# Patient Record
Sex: Male | Born: 1955 | Race: White | Hispanic: No | Marital: Single
Health system: Midwestern US, Academic
[De-identification: ages and names within clinical notes are randomized; demographics above are authoritative.]

---

## 2017-07-10 ENCOUNTER — Encounter: Admit: 2017-07-10 | Discharge: 2017-07-10 | Payer: MEDICAID | Primary: Family

## 2017-07-10 NOTE — Telephone Encounter
Received new referral, via fax. Docs scanned in 07/10/17.

## 2017-07-11 ENCOUNTER — Encounter: Admit: 2017-07-11 | Discharge: 2017-07-11 | Payer: MEDICAID | Primary: Family

## 2017-07-11 NOTE — Telephone Encounter
Service (OLT/Gen Hep/HPB): OLT  Referring: Dr. 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

## 2017-07-11 NOTE — Telephone Encounter
BENEFIT COLLECTION:  SPOKE TO: N/A  Verified by:  Lucilla Lame                                      Date:  July 11, 2017  Ins Plan:  [place] MEDICAID       EFF: 07/11/2017             Plan Type:  SUNFLOWER  ID #:[id number]                GR#: N/A   Subscriber:SELF  CS PHONE# [phone]    Medical Benefits:  Silvio Pate (prev [place] Medicaid):  $48 INPT ADMIT COPAY  NURSING HOME/LONG TERM CARE [place] $3/VISIT  SURGICENTER $3/DOS  OUTPT REHAB $1/VISIT  DME $3/CLAIM  HHC $3/VISIT  NON-EMERGENCY TRANSPORT OR AMBULANCE $3/DOS  OV $2/COPAY  [place] [phone], $3/VISIT  DENTAL $3/DOS  BDCT: NOT REQUIRED    Donor Benefits:  [place] Donor Benefits:NONE ON PLAN  Travel and Lodging for pt:PT ONLY  Donor Travel and Lodging for [place] ON PLAN    RX Plan:SUNFLOWER   Phone #:  [phone]  RX $3/FILL (NO MAIL ORDER)      TXP Network: SUNFLOWER   HYI:FOYDX     Phone #: [phone]   FAX #:  [phone]  Auth requirements: VERBAL EVAL AND LISTING AUTH REQUIRED   CALL REF# N/A

## 2017-07-12 ENCOUNTER — Encounter: Admit: 2017-07-12 | Discharge: 2017-07-12 | Payer: MEDICAID | Primary: Family

## 2017-07-12 NOTE — Telephone Encounter
Called patient to schedule new appointment with Dr. Pamala Hurry on Monday, August 20, 2017 at 8:40am.   Verified demos and mailed appointment letter/map.   Pt stated they have current insurance.

## 2017-08-20 ENCOUNTER — Ambulatory Visit: Admit: 2017-08-20 | Discharge: 2017-08-20 | Payer: MEDICAID | Primary: Family

## 2017-08-20 ENCOUNTER — Encounter: Admit: 2017-08-20 | Discharge: 2017-08-20 | Payer: MEDICAID | Primary: Family

## 2017-08-20 DIAGNOSIS — K7469 Other cirrhosis of liver: Principal | ICD-10-CM

## 2017-08-20 DIAGNOSIS — I8510 Secondary esophageal varices without bleeding: ICD-10-CM

## 2017-08-20 DIAGNOSIS — B182 Chronic viral hepatitis C: ICD-10-CM

## 2017-08-20 LAB — COMPREHENSIVE METABOLIC PANEL
Lab: 1.5 mg/dL — ABNORMAL HIGH (ref 0.3–1.2)
Lab: 109 mg/dL — ABNORMAL HIGH (ref 70–100)
Lab: 137 MMOL/L — ABNORMAL LOW (ref 137–147)
Lab: 27 MMOL/L (ref 21–30)
Lab: 3.5 g/dL (ref 3.5–5.0)
Lab: 32 U/L — ABNORMAL HIGH (ref 7–40)
Lab: 4.3 MMOL/L — ABNORMAL LOW (ref 3.5–5.1)
Lab: 6 K/UL (ref 3–12)
Lab: 7 g/dL (ref 6.0–8.0)
Lab: >60 mL/min (ref >60)
Lab: >60 mL/min — ABNORMAL HIGH (ref >60)

## 2017-08-20 LAB — PROTIME INR (PT): Lab: 1.2 MMOL/L — ABNORMAL HIGH (ref 0.8–1.2)

## 2017-08-20 LAB — CBC AND DIFF
Lab: 0.1 10*3/uL (ref 0–0.20)
Lab: 3.7 M/UL — ABNORMAL LOW (ref 4.4–5.5)
Lab: 6.6 10*3/uL (ref 4.5–11.0)

## 2017-08-20 LAB — ALCOHOL LEVEL: Lab: <10 mg/dL — ABNORMAL LOW (ref 8.5–10.6)

## 2017-08-20 LAB — HEPATITIS A IGG: Lab: POSITIVE U/L (ref 25–110)

## 2017-08-20 LAB — HEPATITIS B SURFACE AB: Lab: 209 m[IU]/mL (ref 7–56)

## 2017-08-20 MED ORDER — AMLODIPINE 5 MG PO TAB
5 mg | ORAL_TABLET | Freq: Every day | ORAL | 6 refills | Status: AC
Start: 2017-08-20 — End: 2018-02-14

## 2017-08-20 NOTE — Progress Notes
Date of Service: 08/20/2017    Tyler Wolfe is a 61 y.o. male.    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  The patient was diagnosed with ascites secondary to decompensated cirrhosis earlier this year.  He was diagnosed with hepatitis C at the time and was treated with SVR at [facility] per the patient.  We do not have records of his hepatitis C treatment at this time.  The patient underwent a screening EGD on 06/13/2017 at which time he was found to have 2 large esophageal varices which were banded .  His ascites and his leg edema has been stable after initiation of Lasix and Aldactone, however he is having side effects from Aldactone with gynecomastia and pain under the nipple.  He underwent a mammogram for this and it was normal per the patient.  Patient thinks he might have acquired his hepatitis C from his ex-wife/ girlfriend who needed several blood transfusions secondary to underlying sputum cytosis.  The patient had 2 professionally placed tattoos and one was placed overseas [place]  He denies any transfusions, IV drug abuse, needle sharing or any high risk sexual behavior.  He denies any hematochezia, melena, hematemesis, abdominal distention, leg edema at this time.  His colonoscopy was 20 years ago and was normal at the time.    Review of Systems   Constitutional: Negative.    HENT: Negative.    Eyes: Negative.    Respiratory: Negative.    Cardiovascular: Negative.    Gastrointestinal: Negative.    Endocrine: Negative.    Musculoskeletal: Negative.    Skin: Negative.    Allergic/Immunologic: Negative.    Neurological: Negative.    Psychiatric/Behavioral: Negative.       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    Past surgical history:  Right knee arthroscopy Past social history:   Quit alcohol in 2017 August.  Before that he was drinking 6-12 pack beer per week with 2 additional shots per week.    Continues to smoke half pack per week.    Used to do marijuana in the past but quit 9 years ago.    Objective:         ??? amLODIPine (NORVASC) 5 mg tablet Take one tablet by mouth daily.   ??? epinephrine(+) (EPIPEN JR) 0.15 mg/0.3 mL (1:2,000) syringe Inject 0.15 mg to area(s) as directed as Needed.     Vitals:    08/20/17 0849   BP: 97/62   Pulse: 57   Resp: 16   Temp: 36.8 ???C (98.3 ???F)   TempSrc: Oral   SpO2: 94%   Weight: 99.2 kg (218 lb 12.8 oz)   Height: 177.8 cm (70)     Body mass index is 31.39 kg/m???.     Labs and Diagnostic Test:  Outside labs reviewed.  Meld score very low.    Imaging:  CT scan of the abdomen in December 2018 which revealed liver cirrhosis and ascites but no evidence of any focal masses.    Physical Exam    Vitals reviewed.   Constitutional: well-developed, well-nourished, in no apparent distress.  Responds appropriately to questions.   Neuro:  No tremor noted.  Patient is AOx4.  HEENT:  Head is normocephalic and atraumatic.  No scleral icterus noted. EOM normal & PERRLA.  Dentures present.  Neck and Lymph: Normal ROM. Neck supple.  Cardiac:  regular rate and rhythm.  No murmurs.  Respiratory:  Normal respiratory excursion  No wheezes  GI:  Abdomen soft, non-distended, non-tender. BS present.  No appreciable ascites.  No hepatomegaly.  Skin:  Skin is warm, dry, and intact. No rash noted.  Spider nevi seen.  Peripheral Vascular: 1+ edema lower extremity.  Musculoskeletal:  ROM intact.    Psychiatric: Normal mood and affect. Behavior is normal. Judgment and thought content normal.    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  But based on his normal creatinine, close to normal T bili and normal sodium his meld score is likely lower.  -We will order ultrasound of his liver during the next visit.  -We will obtain meld labs today and every 6 months from there.  -We will also order hepatitis C PCR to ensure SVR at this time. (Treated earlier this year with SVR per patient at [facility])  -We will also check for hepatitis A and hepatitis B immunity .  ???  Ascites and lower extremity edema:  -  CT in October 2018 showed ascites .  -  Much improved ascites and lower extremity edema  after initiation of Lasix and Aldactone.  However he is having gynecomastia from Aldactone.  Mammogram  done recently and it is normal per the patient . We will discontinue Aldactone at this time.  Will add amiloride 5 mg was  Daily.  Check BMP in 1 weeks.  ???Continue Lasix.    Esophageal varices:  -EGD 06/13/2017 with 2 columns of large varices banded.  -He will need monthly EGDs until his varices are eradicated.  -He is planned for repeat EGD for the patient very soon by Dr. Larina Bras in [place].  ???   Colon cancer screening:  -Prior colonoscopy 20 years ago.  ???Recommend colonoscopy at this time.  This can be done at the time of his EGD with his doctor at [facility].  ???  Health maintenance  - Encourage yearly influenza vaccine  - Encouraged continuing abstinence from alcohol and encouraged quitting smoking at this time.  - It is not advised to take benzodiazapines.  If you are having issues with sleep, you can try trazadone, mirtazapine, lexapro, prozac.  These may help with your stress and sleep issues.  - You can also try benadryl or melatonin for sleep  - Please refrain from any ibuprofen, aleve, motrin, NSAIDS and aspirin   - You can take up to 4 extra strength tylenol per day.  No more than 2000mg  daily    Follow up in 6 months or sooner, if problems arise.    Kathrynn Ducking,  Gastroenterology and hepatology fellow,  [phone]        ATTESTATION I personally performed the key portions of the E/M visit, discussed case with the Fellow and concur with documentation of history, physical exam, assessment, and treatment plan unless otherwise noted.    61yo veteran with newly diagnosed HCV (reported SVR) + EtOH cirrhosis, c/b ascites and EV without hx of GIB undergoing serial banding, referred for consideration of OLT.    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 He is felt to have achieved SVR in the Spring through  the [place] system. Unknown GT and treatment regimen.     Regarding his EtOH use, he reports drinking 6 or 12pack/week, and quit EtOH all together 04/2016. He has not attended any formal counseling. He has had no legal issues related to his alcohol use.    His ascites has been well controlled on low dose Lasix and Aldactone; however, he has developed painful gynecomastia. He is very strict with his Na restriction, typically under 1g/day.    His MELD is lower = 11.    - OLT is deferred at this time 2/2 low MELD. We discussed the etiologies, diagnosis, natural history and management of cirrhosis. We discussed that typically we initiate OLT eval when MELD = 16/17, or when transplant defining condition arises such as HCC >2cm.  - Continue Lasix 40mg  and transition to Amiloride , monitor renal function. He had relatively high K in the past.  - If he should undergo paracentesis, recommend fluid studies.  - Should his ascites become problematic, he could be evaluated for TIPS.   - Updated MELD, HCV PCR  to ensure SVR, and ETG today.  - He needs to stop smoking.  - EGD for follow up banding, and due for CRC screening.  - Doppler US due April for hepatoma screening.  - He should avoid centrally-acting drugs. Recommend Mirtazipine, Trazodone or SSRI to treat his adjustment disorder/insomnia with recent death of mother.

## 2017-08-21 ENCOUNTER — Encounter: Admit: 2017-08-21 | Discharge: 2017-08-21 | Payer: MEDICAID | Primary: Family

## 2017-08-21 DIAGNOSIS — B182 Chronic viral hepatitis C: Principal | ICD-10-CM

## 2017-08-21 NOTE — Progress Notes
Faxed request to [employer]	[place] [phone]

## 2017-08-23 LAB — HEPATITIS C VIRAL LOAD PCR QUANT

## 2017-08-27 ENCOUNTER — Encounter: Admit: 2017-08-27 | Discharge: 2017-08-27 | Payer: MEDICAID | Primary: Family

## 2017-08-27 NOTE — Telephone Encounter
Please call pt with recent lab results.

## 2017-09-14 ENCOUNTER — Encounter: Admit: 2017-09-14 | Discharge: 2017-09-14 | Payer: MEDICAID | Primary: Family

## 2017-09-14 DIAGNOSIS — B182 Chronic viral hepatitis C: Principal | ICD-10-CM

## 2017-09-14 LAB — BASIC METABOLIC PANEL
Lab: 9
Lab: 99

## 2018-01-23 ENCOUNTER — Encounter: Admit: 2018-01-23 | Discharge: 2018-01-23 | Payer: MEDICAID | Primary: Family

## 2018-01-23 ENCOUNTER — Ambulatory Visit: Admit: 2018-01-23 | Discharge: 2018-01-23 | Payer: MEDICAID | Primary: Family

## 2018-01-23 ENCOUNTER — Ambulatory Visit: Admit: 2018-01-23 | Discharge: 2018-01-24 | Payer: MEDICAID | Primary: Family

## 2018-01-23 DIAGNOSIS — R161 Splenomegaly, not elsewhere classified: ICD-10-CM

## 2018-01-23 DIAGNOSIS — B182 Chronic viral hepatitis C: ICD-10-CM

## 2018-01-23 DIAGNOSIS — R600 Localized edema: ICD-10-CM

## 2018-01-23 DIAGNOSIS — K7469 Other cirrhosis of liver: ICD-10-CM

## 2018-01-23 DIAGNOSIS — K8020 Calculus of gallbladder without cholecystitis without obstruction: ICD-10-CM

## 2018-01-23 DIAGNOSIS — Z87898 Personal history of other specified conditions: ICD-10-CM

## 2018-01-23 DIAGNOSIS — K766 Portal hypertension: ICD-10-CM

## 2018-01-23 DIAGNOSIS — I8510 Secondary esophageal varices without bleeding: ICD-10-CM

## 2018-01-23 DIAGNOSIS — K7460 Unspecified cirrhosis of liver: Principal | ICD-10-CM

## 2018-01-23 LAB — CBC AND DIFF
Lab: 0.3 10*3/uL (ref 0–0.45)
Lab: 1 % (ref >60)
Lab: 1.5 10*3/uL (ref 1.0–4.8)
Lab: 12 % (ref 4–12)
Lab: 14 % (ref 11–15)
Lab: 14 g/dL (ref 13.5–16.5)
Lab: 2.6 10*3/uL (ref 1.8–7.0)
Lab: 29 % (ref 24–44)
Lab: 33 pg (ref 26–34)
Lab: 34 g/dL (ref 32.0–36.0)
Lab: 4.2 M/UL — ABNORMAL LOW (ref 4.4–5.5)
Lab: 41 % (ref 40–50)
Lab: 5 K/UL (ref 4.5–11.0)
Lab: 51 % (ref 41–77)
Lab: 7 % — ABNORMAL HIGH (ref >60)
Lab: 9.1 FL (ref 7–11)
Lab: 98 FL (ref 80–100)
Lab: 98 K/UL — ABNORMAL LOW (ref 150–400)

## 2018-01-23 LAB — COMPREHENSIVE METABOLIC PANEL
Lab: 140 MMOL/L (ref 137–147)
Lab: 4.4 MMOL/L (ref 3.5–5.1)

## 2018-01-23 LAB — ALPHA FETO PROTEIN (AFP): Lab: 9.5 ng/mL (ref 0.0–15.0)

## 2018-01-23 LAB — PROTIME INR (PT): Lab: 1.1 (ref 0.8–1.2)

## 2018-01-24 ENCOUNTER — Encounter: Admit: 2018-01-24 | Discharge: 2018-01-24 | Payer: MEDICAID | Primary: Family

## 2018-01-24 DIAGNOSIS — E559 Vitamin D deficiency, unspecified: Principal | ICD-10-CM

## 2018-01-24 LAB — ZINC: Lab: 0.6

## 2018-01-24 LAB — 25-OH VITAMIN D (D2 + D3): Lab: 13 ng/mL — ABNORMAL LOW (ref 30–80)

## 2018-01-24 MED ORDER — ERGOCALCIFEROL (VITAMIN D2) 50,000 UNIT PO CAP
1 | ORAL_CAPSULE | ORAL | 2 refills | 56.00000 days | Status: AC
Start: 2018-01-24 — End: ?

## 2018-01-25 LAB — ETHYL GLUCURONIDE SCREEN WITH REFLEX, URINE: Lab: NEGATIVE

## 2018-01-26 LAB — VITAMIN A: Lab: 10 — ABNORMAL LOW

## 2018-01-28 ENCOUNTER — Encounter: Admit: 2018-01-28 | Discharge: 2018-01-28 | Payer: MEDICAID | Primary: Family

## 2018-01-28 DIAGNOSIS — E509 Vitamin A deficiency, unspecified: Principal | ICD-10-CM

## 2018-01-28 MED ORDER — VITAMIN A 10,000 UNIT PO CAP
10000 [IU] | ORAL_CAPSULE | Freq: Every day | ORAL | 2 refills | Status: AC
Start: 2018-01-28 — End: 2018-07-22

## 2018-02-14 ENCOUNTER — Encounter: Admit: 2018-02-14 | Discharge: 2018-02-14 | Payer: MEDICAID | Primary: Family

## 2018-02-14 DIAGNOSIS — Z87898 Personal history of other specified conditions: Principal | ICD-10-CM

## 2018-02-14 MED ORDER — AMILORIDE 5 MG PO TAB
5 mg | ORAL_TABLET | Freq: Every day | ORAL | 5 refills | 30.00000 days | Status: AC
Start: 2018-02-14 — End: 2018-07-09

## 2018-02-15 ENCOUNTER — Encounter: Admit: 2018-02-15 | Discharge: 2018-02-15 | Payer: MEDICAID | Primary: Family

## 2018-07-09 ENCOUNTER — Encounter: Admit: 2018-07-09 | Discharge: 2018-07-09 | Payer: MEDICAID | Primary: Family

## 2018-07-09 DIAGNOSIS — Z87898 Personal history of other specified conditions: Principal | ICD-10-CM

## 2018-07-09 MED ORDER — AMILORIDE 5 MG PO TAB
5 mg | ORAL_TABLET | Freq: Every day | ORAL | 0 refills | 30.00000 days | Status: AC
Start: 2018-07-09 — End: 2018-11-25

## 2018-07-11 ENCOUNTER — Encounter: Admit: 2018-07-11 | Discharge: 2018-07-11 | Payer: MEDICAID | Primary: Family

## 2018-07-15 ENCOUNTER — Ambulatory Visit: Admit: 2018-07-15 | Discharge: 2018-07-16 | Payer: MEDICAID | Primary: Family

## 2018-07-15 ENCOUNTER — Encounter: Admit: 2018-07-15 | Discharge: 2018-07-15 | Payer: MEDICAID | Primary: Family

## 2018-07-15 ENCOUNTER — Ambulatory Visit: Admit: 2018-07-15 | Discharge: 2018-07-15 | Payer: MEDICAID | Primary: Family

## 2018-07-15 DIAGNOSIS — K7460 Unspecified cirrhosis of liver: ICD-10-CM

## 2018-07-15 DIAGNOSIS — I8510 Secondary esophageal varices without bleeding: ICD-10-CM

## 2018-07-15 DIAGNOSIS — F17210 Nicotine dependence, cigarettes, uncomplicated: ICD-10-CM

## 2018-07-15 DIAGNOSIS — B182 Chronic viral hepatitis C: Principal | ICD-10-CM

## 2018-07-15 DIAGNOSIS — Z1211 Encounter for screening for malignant neoplasm of colon: ICD-10-CM

## 2018-07-15 DIAGNOSIS — Z87898 Personal history of other specified conditions: ICD-10-CM

## 2018-07-15 LAB — COMPREHENSIVE METABOLIC PANEL
Lab: 0.9 mg/dL (ref 0.4–1.24)
Lab: 13 U/L (ref 7–56)
Lab: 139 MMOL/L (ref 137–147)
Lab: 26 MMOL/L (ref 21–30)
Lab: 28 U/L — ABNORMAL HIGH (ref 7–40)
Lab: 4 g/dL (ref 3.5–5.0)
Lab: 4.3 MMOL/L (ref 3.5–5.1)
Lab: 68 U/L (ref 25–110)
Lab: 7 10*3/uL (ref 3–12)
Lab: 7.3 g/dL (ref 6.0–8.0)
Lab: 97 mg/dL (ref 70–100)
Lab: >60 mL/min (ref >60)
Lab: >60 mL/min (ref >60)

## 2018-07-15 LAB — 25-OH VITAMIN D (D2 + D3): Lab: 18 ng/mL — ABNORMAL LOW (ref 30–80)

## 2018-07-15 LAB — PROTIME INR (PT): Lab: 1.2 MMOL/L (ref 0.8–1.2)

## 2018-07-15 LAB — MISC REFERENCE TEST

## 2018-07-15 LAB — CBC AND DIFF
Lab: 0.1 10*3/uL (ref 0–0.20)
Lab: 4.4 M/UL (ref 4.4–5.5)
Lab: 5.8 10*3/uL (ref 4.5–11.0)

## 2018-07-15 LAB — ALPHA FETO PROTEIN (AFP): Lab: 9.7 ng/mL (ref 0.0–15.0)

## 2018-07-18 ENCOUNTER — Encounter: Admit: 2018-07-18 | Discharge: 2018-07-18 | Payer: MEDICAID | Primary: Family

## 2018-07-18 DIAGNOSIS — E509 Vitamin A deficiency, unspecified: Principal | ICD-10-CM

## 2018-07-18 LAB — VITAMIN A: Lab: 17 mg/dL — ABNORMAL LOW (ref 0.3–1.2)

## 2018-07-19 LAB — MISC ARUP TEST: Lab: 201

## 2018-07-22 ENCOUNTER — Encounter: Admit: 2018-07-22 | Discharge: 2018-07-22 | Payer: MEDICAID | Primary: Family

## 2018-07-22 DIAGNOSIS — K7290 Hepatic failure, unspecified without coma: Principal | ICD-10-CM

## 2018-07-22 MED ORDER — ERGOCALCIFEROL (VITAMIN D2) 50,000 UNIT PO CAP
1 | ORAL_CAPSULE | ORAL | 0 refills | 56.00000 days | Status: AC
Start: 2018-07-22 — End: ?

## 2018-07-22 MED ORDER — VITAMIN A 10,000 UNIT PO CAP
10000 [IU] | ORAL_CAPSULE | Freq: Every day | ORAL | 1 refills | Status: AC
Start: 2018-07-22 — End: 2019-01-22

## 2018-08-05 ENCOUNTER — Ambulatory Visit: Admit: 2018-08-05 | Discharge: 2018-08-05 | Payer: MEDICAID | Primary: Family

## 2018-08-05 DIAGNOSIS — B182 Chronic viral hepatitis C: ICD-10-CM

## 2018-08-06 ENCOUNTER — Encounter: Admit: 2018-08-06 | Discharge: 2018-08-06 | Payer: MEDICAID | Primary: Family

## 2018-08-21 ENCOUNTER — Encounter: Admit: 2018-08-21 | Discharge: 2018-08-21 | Payer: MEDICAID | Primary: Family

## 2018-09-19 ENCOUNTER — Encounter: Admit: 2018-09-19 | Discharge: 2018-09-19 | Payer: MEDICAID | Primary: Family

## 2018-10-01 ENCOUNTER — Encounter: Admit: 2018-10-01 | Discharge: 2018-10-01 | Payer: MEDICAID | Primary: Family

## 2018-10-01 MED ORDER — ERGOCALCIFEROL (VITAMIN D2) 1,250 MCG (50,000 UNIT) PO CAP
1 | ORAL_CAPSULE | ORAL | 2 refills
Start: 2018-10-01 — End: ?

## 2018-10-09 ENCOUNTER — Encounter: Admit: 2018-10-09 | Discharge: 2018-10-09 | Payer: MEDICAID | Primary: Family

## 2018-11-23 ENCOUNTER — Encounter: Admit: 2018-11-23 | Discharge: 2018-11-23 | Payer: MEDICAID | Primary: Family

## 2018-11-23 DIAGNOSIS — Z87898 Personal history of other specified conditions: Principal | ICD-10-CM

## 2018-11-25 MED ORDER — AMILORIDE 5 MG PO TAB
ORAL_TABLET | Freq: Every day | ORAL | 0 refills | 30.00000 days | Status: AC
Start: 2018-11-25 — End: 2019-02-17

## 2018-12-27 ENCOUNTER — Encounter: Admit: 2018-12-27 | Discharge: 2018-12-27 | Payer: MEDICAID | Primary: Family

## 2018-12-27 DIAGNOSIS — B182 Chronic viral hepatitis C: ICD-10-CM

## 2018-12-27 DIAGNOSIS — E559 Vitamin D deficiency, unspecified: ICD-10-CM

## 2018-12-27 DIAGNOSIS — E509 Vitamin A deficiency, unspecified: Principal | ICD-10-CM

## 2018-12-27 DIAGNOSIS — Z0000 Encounter for general adult medical examination without abnormal findings: ICD-10-CM

## 2018-12-27 DIAGNOSIS — E60 Dietary zinc deficiency: ICD-10-CM

## 2018-12-27 DIAGNOSIS — Z87898 Personal history of other specified conditions: ICD-10-CM

## 2018-12-27 DIAGNOSIS — K7290 Hepatic failure, unspecified without coma: ICD-10-CM

## 2019-01-14 ENCOUNTER — Encounter: Admit: 2019-01-14 | Discharge: 2019-01-14 | Payer: MEDICAID | Primary: Family

## 2019-01-20 ENCOUNTER — Encounter: Admit: 2019-01-20 | Discharge: 2019-01-20 | Payer: MEDICAID | Primary: Family

## 2019-01-21 ENCOUNTER — Encounter: Admit: 2019-01-21 | Discharge: 2019-01-21 | Payer: MEDICAID | Primary: Family

## 2019-01-21 NOTE — Telephone Encounter
Tried to call pt about his 05/13 appt with Christen Butter no answer and VM not set up.

## 2019-01-22 ENCOUNTER — Encounter: Admit: 2019-01-22 | Discharge: 2019-01-22 | Payer: MEDICAID | Primary: Family

## 2019-01-22 ENCOUNTER — Ambulatory Visit: Admit: 2019-01-22 | Discharge: 2019-01-22 | Payer: MEDICAID | Primary: Family

## 2019-01-22 DIAGNOSIS — K7290 Hepatic failure, unspecified without coma: Secondary | ICD-10-CM

## 2019-01-22 DIAGNOSIS — Z0000 Encounter for general adult medical examination without abnormal findings: Secondary | ICD-10-CM

## 2019-01-22 DIAGNOSIS — K6389 Other specified diseases of intestine: Principal | ICD-10-CM

## 2019-01-22 LAB — COMPREHENSIVE METABOLIC PANEL
Lab: 143 MMOL/L (ref 137–147)
Lab: 4.4 MMOL/L (ref 3.5–5.1)

## 2019-01-22 LAB — 25-OH VITAMIN D (D2 + D3): Lab: 22 ng/mL — ABNORMAL LOW (ref 30–80)

## 2019-01-22 LAB — CBC AND DIFF
Lab: 0 10*3/uL (ref 0–0.20)
Lab: 0.2 10*3/uL (ref 0–0.45)
Lab: 15 g/dL (ref 13.5–16.5)
Lab: 4.6 M/UL — ABNORMAL HIGH (ref 4.4–5.5)
Lab: 45 % (ref 40–50)
Lab: 5.8 K/UL (ref 4.5–11.0)
Lab: 96 FL (ref 80–100)

## 2019-01-22 LAB — TESTOSTERONE,TOTAL: Lab: 601 ng/dL (ref 270–1070)

## 2019-01-22 LAB — PROTIME INR (PT): Lab: 1.1 (ref 0.8–1.2)

## 2019-01-22 LAB — ALPHA FETO PROTEIN (AFP): Lab: 11 ng/mL (ref 0.0–15.0)

## 2019-01-22 LAB — POC CREATININE, RAD: Lab: 0.9 mg/dL (ref 0.4–1.24)

## 2019-01-22 MED ORDER — SODIUM CHLORIDE 0.9 % IJ SOLN
50 mL | Freq: Once | INTRAVENOUS | 0 refills | Status: CP
Start: 2019-01-22 — End: ?
  Administered 2019-01-22: 14:00:00 50 mL via INTRAVENOUS

## 2019-01-22 MED ORDER — IOHEXOL 350 MG IODINE/ML IV SOLN
100 mL | Freq: Once | INTRAVENOUS | 0 refills | Status: CP
Start: 2019-01-22 — End: ?
  Administered 2019-01-22: 14:00:00 100 mL via INTRAVENOUS

## 2019-01-23 ENCOUNTER — Encounter: Admit: 2019-01-23 | Discharge: 2019-01-23 | Payer: MEDICAID | Primary: Family

## 2019-01-23 ENCOUNTER — Ambulatory Visit: Admit: 2019-01-22 | Discharge: 2019-01-23 | Payer: MEDICAID | Primary: Family

## 2019-01-23 DIAGNOSIS — E559 Vitamin D deficiency, unspecified: ICD-10-CM

## 2019-01-23 DIAGNOSIS — E509 Vitamin A deficiency, unspecified: ICD-10-CM

## 2019-01-23 DIAGNOSIS — Z87898 Personal history of other specified conditions: Secondary | ICD-10-CM

## 2019-01-23 DIAGNOSIS — K7460 Unspecified cirrhosis of liver: ICD-10-CM

## 2019-01-23 DIAGNOSIS — B182 Chronic viral hepatitis C: Principal | ICD-10-CM

## 2019-01-23 DIAGNOSIS — E60 Dietary zinc deficiency: Secondary | ICD-10-CM

## 2019-01-23 DIAGNOSIS — M8580 Other specified disorders of bone density and structure, unspecified site: ICD-10-CM

## 2019-01-23 DIAGNOSIS — K7290 Hepatic failure, unspecified without coma: ICD-10-CM

## 2019-01-23 DIAGNOSIS — Z0000 Encounter for general adult medical examination without abnormal findings: ICD-10-CM

## 2019-01-23 LAB — ZINC: Lab: 0.6

## 2019-01-23 NOTE — Telephone Encounter
Referral received via internal. Docs in 02. REQUESTING DR Cathie Beams

## 2019-01-24 ENCOUNTER — Encounter: Admit: 2019-01-24 | Discharge: 2019-01-24 | Payer: MEDICAID | Primary: Family

## 2019-01-24 LAB — VITAMIN A: Lab: 28 — ABNORMAL LOW

## 2019-01-24 LAB — ETHYL GLUCURONIDE SCREEN WITH REFLEX, URINE: Lab: NEGATIVE

## 2019-01-25 LAB — FREE TESTOSTERONE ADULT MALE: Lab: 63

## 2019-01-27 LAB — PHOSPHATIDYLETHANOL: Lab: NEGATIVE

## 2019-01-29 ENCOUNTER — Encounter: Admit: 2019-01-29 | Discharge: 2019-01-29 | Payer: MEDICAID | Primary: Family

## 2019-01-29 NOTE — Telephone Encounter
Spoke to patient. Has MyChart and Zoom downloaded. Patient understands to test Zoom video and audio on chosen device. Was able to log into MyChart to verify they have access. Patient instructed to read the Telehealth consents in MyChart.  Notified patient that the clinic staff will call 30min-1hr prior to visit.

## 2019-01-30 ENCOUNTER — Encounter: Admit: 2019-01-30 | Discharge: 2019-01-30 | Payer: MEDICAID | Primary: Family

## 2019-01-30 DIAGNOSIS — K6389 Other specified diseases of intestine: Principal | ICD-10-CM

## 2019-01-30 DIAGNOSIS — B182 Chronic viral hepatitis C: ICD-10-CM

## 2019-01-30 DIAGNOSIS — Z87898 Personal history of other specified conditions: ICD-10-CM

## 2019-01-30 NOTE — Progress Notes
Telehealth Visit Note    Date of Service: 01/30/2019    Subjective:      Obtained patient's verbal consent to treat them and their agreement to [facility] financial policy and NPP via this telehealth visit during the [facility] Emergency     Tyler Wolfe is a 63 y.o. male.    History of Present Illness  Tyler Wolfe is a 63 year old male who is referred to clinic by Dr. 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 He reports no symptoms related to the mass. He was in his usual state of health at the time of the CT. He denies abdominal pain, distention, nausea, vomiting or changes in his bowel habits. He has cirrhosis secondary to HCV and EtOH. He has a history of esophageal varices and banding. He has a history of ascites. He does not require paracentesis. He has no history of hepatic encephalopathy. He denies unintentional weight loss. He denies flushing of his skin or diarrhea.     Medical Hx: HCV, Cirrhosis   Surgical Hx: No surgical history  Family Hx: Reviewed, no pertinent   Social Hx: History of smoking and EtOH overuse    Social History     Socioeconomic History   ??? Marital status: Single     Spouse name: Not on file   ??? Number of children: Not on file   ??? Years of education: Not on file   ??? Highest education level: Not on file   Occupational History   ??? Not on file   Tobacco Use   ??? Smoking status: Current Every Day Smoker     Packs/day: 0.50     Types: Cigarettes   ??? Smokeless tobacco: Never Used   Substance and Sexual Activity   ??? Alcohol use: Not on file   ??? Drug use: Not on file   ??? Sexual activity: Not on file   Other Topics Concern   ??? Not on file   Social History Narrative   ??? Not on file       Review of Systems   Constitutional: Negative.    HENT: Negative.    Eyes: Negative.    Respiratory: Negative.    Cardiovascular: Negative.    Gastrointestinal: Negative.    Endocrine: Negative. Genitourinary: Negative.    Musculoskeletal: Negative.    Skin: Negative.    Allergic/Immunologic: Negative.    Neurological: Negative.    Hematological: Negative.    Psychiatric/Behavioral: Negative.      Objective:         ??? aMILoride (MIDAMOR) 5 mg tablet TAKE 1 TABLET BY MOUTH EVERY DAY   ??? epinephrine(+) (EPIPEN JR) 0.15 mg/0.3 mL (1:2,000) syringe Inject 0.15 mg to area(s) as directed as Needed.   ??? furosemide (LASIX) 40 mg tablet Take 40 mg by mouth every morning.     Vitals:    01/30/19 0844   Weight: 104.3 kg (230 lb)   Height: 177.8 cm (70)   PainSc: Zero     Body mass index is 33 kg/m???.     Physical Exam  No physical exam. Telehealth appointment    Assessment and Plan:  Mr. Stecher is a 63 year old male with cirrhosis secondary to     - Discussed the risk of hepatic decompensation with surgical intervention  - Discussed additional workup to try and ascertain the etiology of the mass  - Will obtain Chromagranin A given concern  for potential neuroendocrine tumor  - If Chromagranin A elevated will obtain Dotatate scan to assess for uptake and additional lesions    - Will plan for potential diagnostic laparoscopy to visualize the mass with potential resection pending above work up    - Mayo Risk Calculator for postoperative mortality in patients with cirrhosis calculated.  Mortality risk  7 day: 0.75%  30 days: 3.05%  90 days: 4.88%  1 year: 15.7%  5 year: 37.7%    Liane Comber, M.D.  Surgical Resident, PGY-5  Department of Surgery    30 minutes spent on this patient's encounter with counseling and coordination of care taking >50% of the visit.    I personally performed the key portions of the E/M visit, discussed case with the resident and concur with the documentation of the history, physical exam, assessment, and treatment plan.  I have edited this note where appropriate.    Jimmy Footman, MD PhD  Transplant Surgery  Department of Surgery

## 2019-01-31 ENCOUNTER — Ambulatory Visit: Admit: 2019-01-30 | Discharge: 2019-01-31 | Payer: MEDICAID | Primary: Family

## 2019-01-31 DIAGNOSIS — K6389 Other specified diseases of intestine: Secondary | ICD-10-CM

## 2019-01-31 DIAGNOSIS — B182 Chronic viral hepatitis C: Principal | ICD-10-CM

## 2019-01-31 DIAGNOSIS — K7460 Unspecified cirrhosis of liver: ICD-10-CM

## 2019-01-31 NOTE — Telephone Encounter
Returned call and LVM. Paelyn Smick, RN

## 2019-02-04 ENCOUNTER — Encounter: Admit: 2019-02-04 | Discharge: 2019-02-04 | Payer: MEDICAID | Primary: Family

## 2019-02-04 NOTE — Telephone Encounter
Reviewed his visit with Dr.Kumer. Discussed he would like his dotatate scan at [facility] and his Chromogranin A test as well. NC will order. Gave him the number to call and schedule at his convenience.

## 2019-02-05 ENCOUNTER — Encounter: Admit: 2019-02-05 | Discharge: 2019-02-05 | Payer: MEDICAID | Primary: Family

## 2019-02-07 ENCOUNTER — Encounter: Admit: 2019-02-07 | Discharge: 2019-02-07 | Payer: MEDICAID | Primary: Family

## 2019-02-16 ENCOUNTER — Encounter: Admit: 2019-02-16 | Discharge: 2019-02-16 | Primary: Family

## 2019-02-16 DIAGNOSIS — Z87898 Personal history of other specified conditions: Secondary | ICD-10-CM

## 2019-02-17 MED ORDER — AMILORIDE 5 MG PO TAB
ORAL_TABLET | Freq: Every day | ORAL | 0 refills | 30.00000 days | Status: DC
Start: 2019-02-17 — End: 2019-05-12

## 2019-02-26 ENCOUNTER — Encounter: Admit: 2019-02-26 | Discharge: 2019-02-26 | Primary: Family

## 2019-02-26 NOTE — Telephone Encounter
Called pt to discuss PET scan denial from [facility] that we are working on getting approval but he did not answer and unable to leave a VM. Will send mychart message.

## 2019-02-27 ENCOUNTER — Encounter: Admit: 2019-02-27 | Discharge: 2019-02-27 | Primary: Family

## 2019-02-27 NOTE — Telephone Encounter
Answered some PET scan and biopsy vs surgery questions and potential risk with liver decompensation. PET planned for 6/26 [facility]. Discussed this with the patient.

## 2019-03-07 ENCOUNTER — Ambulatory Visit: Admit: 2019-03-07 | Discharge: 2019-03-08 | Primary: Family

## 2019-03-07 ENCOUNTER — Encounter: Admit: 2019-03-07 | Discharge: 2019-03-07 | Primary: Family

## 2019-03-07 DIAGNOSIS — Z87898 Personal history of other specified conditions: Secondary | ICD-10-CM

## 2019-03-07 DIAGNOSIS — B182 Chronic viral hepatitis C: Secondary | ICD-10-CM

## 2019-03-07 DIAGNOSIS — K7460 Unspecified cirrhosis of liver: Secondary | ICD-10-CM

## 2019-03-07 DIAGNOSIS — K6389 Other specified diseases of intestine: Principal | ICD-10-CM

## 2019-03-07 MED ORDER — RP DX GA-68 DOTATATE MCI
5.4 | Freq: Once | INTRAVENOUS | 0 refills | Status: CP
Start: 2019-03-07 — End: ?

## 2019-03-11 ENCOUNTER — Encounter: Admit: 2019-03-11 | Discharge: 2019-03-11 | Primary: Family

## 2019-03-11 NOTE — Telephone Encounter
pt LVM asking for CB regarding results.

## 2019-03-11 NOTE — Telephone Encounter
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 Reviewed with provider and no surgical intervention at this time. Pt to continue to follow with Dr.Haglund and she can determine if he needs a f/u scan in 68mo to a year.

## 2019-04-07 ENCOUNTER — Encounter: Admit: 2019-04-07 | Discharge: 2019-04-07 | Primary: Family

## 2019-04-07 ENCOUNTER — Ambulatory Visit: Admit: 2019-04-07 | Discharge: 2019-04-07 | Primary: Family

## 2019-04-07 DIAGNOSIS — I8500 Esophageal varices without bleeding: Secondary | ICD-10-CM

## 2019-04-07 DIAGNOSIS — Z1159 Encounter for screening for other viral diseases: Secondary | ICD-10-CM

## 2019-04-07 NOTE — Patient Education
Called and scheduled pt for his Urgent EGD. Reviewed instructions with pt and he verbalized understanding. Pt is looking for a ride as he lives 2 hours away. Instructions sent as requested to My Chart for patient.Provided my name and direet number if he has trouble finding a ride.  EGD (ESOPHAGOGASTRODUODENOSCOPY) PREP      Upper GI endoscopy allows healthcare providers to look directly into the beginning of your gastrointestinal(GI) tract.  The esophagus, stomach, and duodenum (first part of the small intestine) make up the upper GI tract.      5 Days Prior:  1. Check with your prescribing physician for instructions about stopping your blood thinner.  Examples of blood thinners are Aleve, Aspirin. Coumadin, Eliquis, Ibuprofen, Naproxen, Plavix, and Xarelto.  2. Do not give yourself a Lovenox injection the morning of the test. Lovenox injections may be taken as usual through the day before your test.    Day of Exam:  1. Do not eat or drink anything after midnight the night before your exam. However, if your exam is in the afternoon you may drink clear liquids only up until (4) hours before your scheduled procedure time.  After this, you should have nothing by mouth.  This includes GUM or CANDY.   a. Chewing tobacco must be stopped  (6) hours before your scheduled procedure.   b. If you have an early morning test, take ONLY your essential morning medications (heart, blood pressure, seizure, etc.) with a small sip of water.   c. You will be sedated for the procedure. A responsible adult must drive you home (no Benedetto Goad, taxis, or buses are permitted). If you do not have a driver we will be unable to do the test.   d. You will be here for (3-4) hours from arrival time.   e. You will not be able to return to work the same day.   f. Please bring a list of your current medications and the dosages with you.     The Procedure:  ? You will lie on the endoscopy table. Usually patients lie on the left side. ? You will be monitored and given oxygen.   ? You are given sedation (relaxing) medication through an intravenous (IV) line.  ? The healthcare provider will put the endoscope in your mouth and down your esophagus. It is thinner than most pieces of food that you swallow.  It will not affect your breathing. The medicine helps keep you from gagging.   ? Air is inserted to expand your GI tract. It can make you burp.  ? During the procedure, the healthcare provider can take biopsies (tissue samples), remove abnormalities such as polyps, or treat abnormalities though a variety of devices placed through the endoscope. You will not feel this.   ? The endoscope carries images of your upper GI tract to a video screen.  ? An adult must drive you home.

## 2019-04-07 NOTE — Patient Education
Covid nasal swab ordered and scheduled.

## 2019-04-07 NOTE — Telephone Encounter
Contacted pt to let him know that we need to update his EGD as he was banded in Jan in [place] and has not had another EGD since.  Pt stated that the surgeon that banded him, did not mention that he needed another procedure.  Advised pt that if he is banded, he has to have an EGD monthly until the varices are eradicated.  Pt then let me know how upset he was that when he went to his appt for possible mesenteric mass surgery, he did not get answers to his questions.  He then said that he never got answers to his question after his PET.  Advised pt that I would discuss with Dr. Pamala Hurry to make a plan going forward.  Pt felt much better after this conversation which took over 25 minutes.

## 2019-04-29 ENCOUNTER — Encounter: Admit: 2019-04-29 | Discharge: 2019-04-29 | Payer: MEDICAID | Primary: Family

## 2019-04-29 NOTE — Telephone Encounter
Tyler Wolfe pt has a procedure 09/15 he has several questions about appt with CM and Procedure and COVID-19 testing.

## 2019-05-12 ENCOUNTER — Encounter: Admit: 2019-05-12 | Discharge: 2019-05-12 | Primary: Family

## 2019-05-12 DIAGNOSIS — Z87898 Personal history of other specified conditions: Secondary | ICD-10-CM

## 2019-05-12 MED ORDER — AMILORIDE 5 MG PO TAB
ORAL_TABLET | Freq: Every day | ORAL | 1 refills | 30.00000 days | Status: DC
Start: 2019-05-12 — End: 2019-11-03

## 2019-05-22 ENCOUNTER — Encounter: Admit: 2019-05-22 | Discharge: 2019-05-22 | Primary: Family

## 2019-05-22 NOTE — Telephone Encounter
Pt called upset that he had not been notified of if and when he needed a COVID test before his GI scope on 9/15. Primary NC who ordered was not available. Answered his questions and scheduled him for a COVID test at [facility].He has not received any communication regarding this procedure.

## 2019-05-24 ENCOUNTER — Encounter: Admit: 2019-05-24 | Discharge: 2019-05-24 | Primary: Family

## 2019-05-24 NOTE — Progress Notes
Patient arrived to [place] clinic for COVID-19 testing 05/24/19 0959. Patient identity confirmed via photo I.D. Nasopharyngeal procedure explained to the patient.   Nasopharyngeal swab completed left  Patient education provided given and instructed patient self isolate until contacted w/ results and further instructions. CDC handout on COVID-19 given to patient.   [email or web address]    Swab collected by [facility].    Date symptoms began/reason for testing: pre op

## 2019-05-25 ENCOUNTER — Encounter: Admit: 2019-05-24 | Discharge: 2019-05-25 | Primary: Family

## 2019-05-25 ENCOUNTER — Encounter: Admit: 2019-05-25 | Discharge: 2019-05-25 | Primary: Family

## 2019-05-25 DIAGNOSIS — Z1159 Encounter for screening for other viral diseases: Principal | ICD-10-CM

## 2019-05-25 LAB — COVID-19 (SARS-COV-2) PCR

## 2019-05-27 ENCOUNTER — Ambulatory Visit: Admit: 2019-05-27 | Discharge: 2019-05-27 | Payer: MEDICAID | Primary: Family

## 2019-05-27 ENCOUNTER — Encounter: Admit: 2019-05-27 | Discharge: 2019-05-27 | Payer: MEDICAID | Primary: Family

## 2019-05-27 MED ORDER — LACTATED RINGERS IV SOLP
1000 mL | INTRAVENOUS | 0 refills | Status: DC
Start: 2019-05-27 — End: 2019-05-27
  Administered 2019-05-27: 15:00:00 1000.000 mL via INTRAVENOUS

## 2019-05-27 MED ORDER — FENTANYL CITRATE (PF) 50 MCG/ML IJ SOLN
0 refills | Status: DC
Start: 2019-05-27 — End: 2019-05-27
  Administered 2019-05-27 (×2): 50 ug via INTRAVENOUS

## 2019-05-27 MED ORDER — PROPOFOL 10 MG/ML IV EMUL 20 ML (INFUSION)(AM)(OR)
INTRAVENOUS | 0 refills | Status: DC
Start: 2019-05-27 — End: 2019-05-27
  Administered 2019-05-27: 15:00:00 150 ug/kg/min via INTRAVENOUS

## 2019-05-27 MED ORDER — LIDOCAINE (PF) 200 MG/10 ML (2 %) IJ SYRG
0 refills | Status: DC
Start: 2019-05-27 — End: 2019-05-27
  Administered 2019-05-27: 15:00:00 50 mg via INTRAVENOUS

## 2019-05-27 MED ORDER — PROPOFOL INJ 10 MG/ML IV VIAL
0 refills | Status: DC
Start: 2019-05-27 — End: 2019-05-27
  Administered 2019-05-27: 15:00:00 30 mg via INTRAVENOUS
  Administered 2019-05-27: 15:00:00 25 mg via INTRAVENOUS
  Administered 2019-05-27: 15:00:00 100 mg via INTRAVENOUS
  Administered 2019-05-27: 15:00:00 20 mg via INTRAVENOUS
  Administered 2019-05-27: 15:00:00 50 mg via INTRAVENOUS

## 2019-05-27 NOTE — Anesthesia Pre-Procedure Evaluation
Anesthesia Pre-Procedure Evaluation    Name: Tyler Wolfe      MRN: [id number]     DOB: 24-Mar-1956     Age: 63 y.o.     Sex: male   _________________________________________________________________________     Procedure Info:   Procedure Information     Date/Time:  05/27/19 0925    Procedure:  ESOPHAGOGASTRODUODENOSCOPY WITH BIOPSY - FLEXIBLE (N/A )    Location:  ENDO 4 / ENDO/GI    Surgeon:  Cardell Peach, MD          Physical Assessment  Vital Signs (last filed in past 24 hours):      See nursing notes     Patient History   Allergies   Allergen Reactions   ? Bee Sting [Allergen Ext-Venom-Honey Bee] SHORTNESS OF BREATH   ? Pcn [Penicillins] ANAPHYLAXIS        Current Medications    Medication Directions   aMILoride (MIDAMOR) 5 mg tablet TAKE 1 TABLET BY MOUTH EVERY DAY   epinephrine(+) (EPIPEN JR) 0.15 mg/0.3 mL (1:2,000) syringe Inject 0.15 mg to area(s) as directed as Needed.   furosemide (LASIX) 40 mg tablet Take 40 mg by mouth every morning.         Review of Systems/Medical History        No history of anesthetic complications  No family history of anesthetic complications      Airway         No history of head/neck radiation      No prior tracheostomy      Pulmonary       Not a current smoker        No indications/hx of asthma    no COPD      No sleep apnea      Cardiovascular         Exercise tolerance: >4 METS      Beta Blocker therapy: No      No past MI,       No indications/hx of CHF      GI/Hepatic/Renal         Hepatitis      Liver disease      Cirrhosis      Neuro/Psych       No seizures      No hx TIA      No CVA      Musculoskeletal         No back pain      No arthritis      Endocrine/Other       No diabetes      No hypothyroidism      No hyperthyroidism   Physical Exam    Airway Findings      Mallampati: II      TM distance: >3 FB      Neck ROM: full      Mouth opening: good      Airway patency: adequate      Comments: Thick, long beard    Dental Findings: Increased risk for dental injury; pt advised and poor dentition    Cardiovascular Findings:       Rhythm: regular      Rate: normal    Pulmonary Findings:    No wheezes or no stridor.    Abdominal Findings:       Not obese    Neurological Findings:       Alert and oriented x 3    Normal mental status  Diagnostic Tests  Hematology:   Lab Results   Component Value Date    HGB 15.5 01/22/2019    HCT 45.1 01/22/2019    PLTCT 115 01/22/2019    WBC 5.8 01/22/2019    NEUT 65 01/22/2019    ANC 3.84 01/22/2019    ALC 1.09 01/22/2019    MONA 10 01/22/2019    AMC 0.59 01/22/2019    EOSA 5 01/22/2019    ABC 0.04 01/22/2019    MCV 96.8 01/22/2019    MCH 33.2 01/22/2019    MCHC 34.3 01/22/2019    MPV 9.2 01/22/2019    RDW 14.1 01/22/2019         General Chemistry:   Lab Results   Component Value Date    NA 143 01/22/2019    K 4.4 01/22/2019    CL 107 01/22/2019    CO2 28 01/22/2019    GAP 8 01/22/2019    BUN 11 01/22/2019    CR 0.9 01/22/2019    CR 0.83 01/22/2019    GLU 112 01/22/2019    GLU 99 09/12/2017    CA 9.8 01/22/2019    ALBUMIN 4.0 01/22/2019    TOTBILI 0.8 01/22/2019      Coagulation:   Lab Results   Component Value Date    INR 1.1 01/22/2019         Anesthesia Plan    ASA score: 3   Plan: MAC      Informed Consent  Anesthetic plan and risks discussed with patient.  Use of blood products discussed with patient

## 2019-05-27 NOTE — Anesthesia Post-Procedure Evaluation
Post-Anesthesia Evaluation    Name: Tyler Wolfe      MRN: [id number]     DOB: 1955/11/15     Age: 63 y.o.     Sex: male   __________________________________________________________________________     Procedure Information     Anesthesia Start Date/Time:  05/27/19 0937    Procedure:  ESOPHAGOGASTRODUODENOSCOPY WITH BIOPSY - FLEXIBLE (N/A )    Location:  ENDO 4 / ENDO/GI    Surgeon:  Retta Mac, MD          Post-Anesthesia Vitals  BP: 119/91 (09/15 0957)  Temp: 37 C (98.6 F) (09/15 0907)  Pulse: 77 (09/15 0907)  Respirations: 15 PER MINUTE (09/15 0907)  SpO2: 100 % (09/15 0907)  Height: 179.1 cm (70.5") (09/15 0907)   Vitals Value Taken Time   BP 119/91 05/27/2019  9:57 AM   Temp     Pulse 71 05/27/2019 10:05 AM   Respirations 14 PER MINUTE 05/27/2019 10:05 AM   SpO2 99 % 05/27/2019 10:05 AM   Vitals shown include unvalidated device data.      Post Anesthesia Evaluation Note    Evaluation location: Pre/Post  Patient participation: recovered; patient participated in evaluation  Level of consciousness: alert  Pain management: adequate    Hydration: normovolemia  Temperature: 36.0C - 38.4C  Airway patency: adequate    Perioperative Events      Postoperative Status  Cardiovascular status: hemodynamically stable        Perioperative Events  Perioperative Event: No  Emergency Case Activation: No

## 2019-05-28 ENCOUNTER — Encounter: Admit: 2019-05-28 | Discharge: 2019-05-28 | Payer: MEDICAID | Primary: Family

## 2019-06-02 ENCOUNTER — Encounter: Admit: 2019-06-02 | Discharge: 2019-06-02 | Payer: MEDICAID | Primary: Family

## 2019-06-02 NOTE — Telephone Encounter
[place] pt said he rec'd a message from [place] about he results and wanted you to push the results through.  Something about an appt he had on 09/15.

## 2019-07-28 ENCOUNTER — Encounter: Admit: 2019-07-28 | Discharge: 2019-07-28 | Payer: MEDICAID | Primary: Family

## 2019-07-28 ENCOUNTER — Ambulatory Visit: Admit: 2019-07-28 | Discharge: 2019-07-28 | Payer: MEDICAID | Primary: Family

## 2019-07-28 ENCOUNTER — Ambulatory Visit: Admit: 2019-07-28 | Discharge: 2019-07-29 | Payer: MEDICAID | Primary: Family

## 2019-07-28 DIAGNOSIS — N186 End stage renal disease: Secondary | ICD-10-CM

## 2019-07-28 DIAGNOSIS — K7460 Unspecified cirrhosis of liver: Secondary | ICD-10-CM

## 2019-07-28 DIAGNOSIS — Z0000 Encounter for general adult medical examination without abnormal findings: Secondary | ICD-10-CM

## 2019-07-28 DIAGNOSIS — I10 Essential (primary) hypertension: Secondary | ICD-10-CM

## 2019-07-28 LAB — COMPREHENSIVE METABOLIC PANEL
Lab: 0.9 mg/dL — ABNORMAL LOW (ref 0.4–1.24)
Lab: 1.2 mg/dL (ref 0.3–1.2)
Lab: 104 mg/dL — ABNORMAL HIGH (ref 70–100)
Lab: 11 mg/dL (ref 7–25)
Lab: 14 U/L (ref 7–56)
Lab: 141 MMOL/L (ref 137–147)
Lab: 24 U/L (ref 7–40)
Lab: 28 MMOL/L (ref 21–30)
Lab: 4.3 g/dL (ref 3.5–5.0)
Lab: 4.5 MMOL/L (ref 3.5–5.1)
Lab: 60 U/L (ref 25–110)
Lab: 8 g/dL (ref 6.0–8.0)
Lab: 9.9 mg/dL (ref 8.5–10.6)
Lab: >60 mL/min (ref >60)
Lab: >60 mL/min (ref >60)
Lab: 7 (ref 3–12)

## 2019-07-28 LAB — 25-OH VITAMIN D (D2 + D3): Lab: 21 ng/mL — ABNORMAL LOW (ref 30–80)

## 2019-07-28 LAB — PROTIME INR (PT): Lab: 1.2 FL (ref 0.8–1.2)

## 2019-07-28 LAB — CBC AND DIFF
Lab: 33 pg (ref 26–34)
Lab: 4.6 M/UL (ref 4.4–5.5)
Lab: 44 % (ref 40–50)
Lab: 5.8 10*3/uL (ref 4.5–11.0)

## 2019-07-28 LAB — MAGNESIUM: Lab: 1.9 mg/dL — ABNORMAL LOW (ref 1.6–2.6)

## 2019-07-30 ENCOUNTER — Encounter: Admit: 2019-07-30 | Discharge: 2019-07-30 | Payer: MEDICAID | Primary: Family

## 2019-07-30 MED ORDER — ERGOCALCIFEROL (VITAMIN D2) 1,250 MCG (50,000 UNIT) PO CAP
1 | ORAL_CAPSULE | ORAL | 0 refills | 56.00000 days | Status: AC
Start: 2019-07-30 — End: ?

## 2019-07-30 NOTE — Telephone Encounter
Awaiting chormanigan results and recommendations per Dr Stevie Kern. See result notes sent to [facility]

## 2019-07-30 NOTE — Telephone Encounter
Patient called in asking if his lab results have been processed and if they could be released to his My Chart.  He also needs to know if  He will be getting a vitamin supplement script.  Please give him a call.

## 2019-07-30 NOTE — Telephone Encounter
NC spoke with Tyler Wolfe and discussed his labs.  Electrolytes appropriate patient should begin drinking tonic water daily to help with cramping as suggested by Dr. Pamala Hurry. Patient will also be prescribed vitamin D 50,000 units weekly for 12 weeks.  MELD score discussed MELD 9  all questions and concerns addressed.     Chrom level being reviewed with Dr. Stevie Kern and Dr.Haglund.

## 2019-08-04 ENCOUNTER — Encounter: Admit: 2019-08-04 | Discharge: 2019-08-04 | Payer: MEDICAID | Primary: Family

## 2019-08-04 ENCOUNTER — Ambulatory Visit: Admit: 2019-08-04 | Discharge: 2019-08-04 | Payer: MEDICAID | Primary: Family

## 2019-08-04 DIAGNOSIS — K7469 Other cirrhosis of liver: Secondary | ICD-10-CM

## 2019-08-04 DIAGNOSIS — Z0000 Encounter for general adult medical examination without abnormal findings: Secondary | ICD-10-CM

## 2019-08-25 ENCOUNTER — Encounter: Admit: 2019-08-25 | Discharge: 2019-08-25 | Payer: MEDICAID | Primary: Family

## 2019-08-25 ENCOUNTER — Ambulatory Visit: Admit: 2019-08-25 | Discharge: 2019-08-25 | Payer: MEDICAID | Primary: Family

## 2019-08-25 DIAGNOSIS — K7469 Other cirrhosis of liver: Secondary | ICD-10-CM

## 2019-08-25 DIAGNOSIS — Z0000 Encounter for general adult medical examination without abnormal findings: Secondary | ICD-10-CM

## 2019-11-02 ENCOUNTER — Encounter: Admit: 2019-11-02 | Discharge: 2019-11-02 | Payer: MEDICAID | Primary: Family

## 2019-11-02 DIAGNOSIS — Z87898 Personal history of other specified conditions: Secondary | ICD-10-CM

## 2019-11-03 ENCOUNTER — Encounter: Admit: 2019-11-03 | Discharge: 2019-11-03 | Payer: MEDICAID | Primary: Family

## 2019-11-03 DIAGNOSIS — K7469 Other cirrhosis of liver: Secondary | ICD-10-CM

## 2019-11-03 MED ORDER — AMILORIDE 5 MG PO TAB
ORAL_TABLET | Freq: Every day | ORAL | 1 refills | 30.00000 days | Status: AC
Start: 2019-11-03 — End: ?

## 2019-11-03 NOTE — Telephone Encounter
Contacted pt as I noticed his EGD was canceled.  Pt stated that someone from GI was to call him back to reschedule but never did.  Advised that I would transfer to endo for him to reschedule.  Also advised pt that he is due for CT at next visit and that we will call him with time.  Pt agreed with plan

## 2019-11-05 ENCOUNTER — Encounter: Admit: 2019-11-05 | Discharge: 2019-11-05 | Payer: MEDICAID | Primary: Family

## 2020-01-12 ENCOUNTER — Encounter: Admit: 2020-01-12 | Discharge: 2020-01-12 | Payer: MEDICAID | Primary: Family

## 2020-01-20 ENCOUNTER — Encounter: Admit: 2020-01-20 | Discharge: 2020-01-20 | Payer: MEDICAID | Primary: Family

## 2020-01-20 ENCOUNTER — Ambulatory Visit: Admit: 2020-01-20 | Discharge: 2020-01-20 | Payer: MEDICAID | Primary: Family

## 2020-01-20 DIAGNOSIS — I10 Essential (primary) hypertension: Secondary | ICD-10-CM

## 2020-01-20 MED ORDER — LACTATED RINGERS IV SOLP
1000 mL | INTRAVENOUS | 0 refills | Status: DC
Start: 2020-01-20 — End: 2020-01-20

## 2020-01-20 MED ORDER — FENTANYL CITRATE (PF) 50 MCG/ML IJ SOLN
25 ug | INTRAVENOUS | 0 refills | Status: CN | PRN
Start: 2020-01-20 — End: ?

## 2020-01-20 MED ORDER — LIDOCAINE (PF) 200 MG/10 ML (2 %) IJ SYRG
0 refills | Status: DC
Start: 2020-01-20 — End: 2020-01-20

## 2020-01-20 MED ORDER — PROMETHAZINE 25 MG/ML IJ SOLN
6.25 mg | INTRAVENOUS | 0 refills | Status: CN | PRN
Start: 2020-01-20 — End: ?

## 2020-01-20 MED ORDER — PROPOFOL 10 MG/ML IV EMUL 20 ML (INFUSION)(AM)(OR)
INTRAVENOUS | 0 refills | Status: DC
Start: 2020-01-20 — End: 2020-01-20

## 2020-01-20 MED ORDER — METOCLOPRAMIDE HCL 5 MG/ML IJ SOLN
10 mg | Freq: Once | INTRAVENOUS | 0 refills | Status: CN | PRN
Start: 2020-01-20 — End: ?

## 2020-01-20 NOTE — Anesthesia Pre-Procedure Evaluation
Anesthesia Pre-Procedure Evaluation    Name: Tyler Wolfe      MRN: [id number]     DOB: 1956/01/31     Age: 64 y.o.     Sex: male   _________________________________________________________________________     Procedure Info:   Procedure Information     Date/Time: 01/20/20 0905    Procedure: ESOPHAGOGASTRODUODENOSCOPY WITH SPECIMEN COLLECTION BY BRUSHING/ WASHING (N/A )    Location: ENDO 4 / ENDO/GI    Surgeons: Cherlynn June, MD          Physical Assessment  Vital Signs (last filed in past 24 hours):  BP: 137/93 (05/11 0800)  Temp: 36.5 ?C (97.7 ?F) (05/11 0800)  Pulse: 84 (05/11 0800)  Respirations: 13 PER MINUTE (05/11 0800)  SpO2: 98 % (05/11 0800)  Height: 177.8 cm (70) (05/11 0800)  Weight: 102.1 kg (225 lb) (05/11 0800)   See nursing notes     Patient History   Allergies   Allergen Reactions   ? Bee Sting [Allergen Ext-Venom-Honey Bee] SHORTNESS OF BREATH   ? Pcn [Penicillins] ANAPHYLAXIS        Current Medications    Medication Directions   aMILoride (MIDAMOR) 5 mg tablet TAKE 1 TABLET BY MOUTH EVERY DAY   epinephrine(+) (EPIPEN JR) 0.15 mg/0.3 mL (1:2,000) syringe Inject 0.15 mg to area(s) as directed as Needed.   furosemide (LASIX) 40 mg tablet Take 40 mg by mouth every morning.   vitamin A 10,000 unit capsule Take 1 tablet by mouth daily.         Review of Systems/Medical History        PONV Screening: Postoperative opioids  No history of anesthetic complications  No family history of anesthetic complications      Airway         No history of head/neck radiation      No prior tracheostomy      Pulmonary       Not a current smoker        No indications/hx of asthma    no COPD      No sleep apnea      Cardiovascular         Exercise tolerance: >4 METS      Beta Blocker therapy: No      No past MI,       No indications/hx of CHF      GI/Hepatic/Renal         Liver disease      Cirrhosis      Hepatitis      Neuro/Psych       No seizures      No hx TIA      No CVA      Musculoskeletal         No back pain      No arthritis      Endocrine/Other       No diabetes      No hypothyroidism      No hyperthyroidism   Physical Exam    Airway Findings      Mallampati: II      TM distance: >3 FB      Neck ROM: full      Mouth opening: good      Airway patency: adequate      Comments: Thick, long beard    Dental Findings:       Increased risk for dental injury; pt advised and poor dentition    Cardiovascular  Findings:       Rhythm: regular      Rate: normal    Pulmonary Findings:    No wheezes or no stridor.    Abdominal Findings:       Not obese    Neurological Findings:       Alert and oriented x 3    Normal mental status       Diagnostic Tests  Hematology:   Lab Results   Component Value Date    HGB 15.4 07/28/2019    HCT 44.8 07/28/2019    PLTCT 112 07/28/2019    WBC 5.8 07/28/2019    NEUT 67 07/28/2019    ANC 3.90 07/28/2019    ALC 1.17 07/28/2019    MONA 9 07/28/2019    AMC 0.53 07/28/2019    EOSA 3 07/28/2019    ABC 0.06 07/28/2019    MCV 96.0 07/28/2019    MCH 33.0 07/28/2019    MCHC 34.4 07/28/2019    MPV 9.1 07/28/2019    RDW 13.5 07/28/2019         General Chemistry:   Lab Results   Component Value Date    NA 141 07/28/2019    K 4.5 07/28/2019    CL 106 07/28/2019    CO2 28 07/28/2019    GAP 7 07/28/2019    BUN 11 07/28/2019    CR 0.97 07/28/2019    GLU 104 07/28/2019    GLU 99 09/12/2017    CA 9.9 07/28/2019    ALBUMIN 4.3 07/28/2019    MG 1.9 07/28/2019    TOTBILI 1.2 07/28/2019      Coagulation:   Lab Results   Component Value Date    INR 1.2 07/28/2019         Anesthesia Plan    ASA score: 3   Plan: MAC  NPO status: acceptable      Informed Consent  Anesthetic plan and risks discussed with patient.  Use of blood products discussed with patient      Plan discussed with: anesthesiologist and CRNA.

## 2020-01-20 NOTE — Anesthesia Post-Procedure Evaluation
Post-Anesthesia Evaluation    Name: Tyler Wolfe      MRN: [id number]     DOB: 1956-05-25     Age: 64 y.o.     Sex: male   __________________________________________________________________________     Procedure Information     Anesthesia Start Date/Time: 01/20/20 0857    Procedure: ESOPHAGOGASTRODUODENOSCOPY WITH SPECIMEN COLLECTION BY BRUSHING/ WASHING (N/A )    Location: ENDO 4 / ENDO/GI    Surgeons: Cherlynn June, MD          Post-Anesthesia Vitals      Vitals Value Taken Time   BP 101/82 01/20/20 0935   Temp     Pulse 77 01/20/20 0935   Respirations 17 PER MINUTE 01/20/20 0935   SpO2 96 % 01/20/20 0935         Post Anesthesia Evaluation Note    Evaluation location: Pre/Post  Patient participation: recovered; patient participated in evaluation  Level of consciousness: alert  Pain management: adequate    Hydration: normovolemia  Temperature: 36.0?C - 38.4?C  Airway patency: adequate    Perioperative Events       Post-op nausea and vomiting: no PONV    Postoperative Status  Cardiovascular status: hemodynamically stable  Respiratory status: spontaneous ventilation  Additional comments: Post-Anesthesia Evaluation Attestation: I reviewed and agree the indicated post-anesthesia care was provided. I have reviewed key portions of the indicated post anesthesia care. I have examined the patient's vitals, physical status, and complications and agree with what is documented.    Staff name:  Theodis Aguas, MD Date:  01/20/2020            Perioperative Events  Perioperative Event: No  Emergency Case Activation: No

## 2020-01-21 ENCOUNTER — Encounter: Admit: 2020-01-21 | Discharge: 2020-01-21 | Payer: MEDICAID | Primary: Family

## 2020-01-21 DIAGNOSIS — I10 Essential (primary) hypertension: Secondary | ICD-10-CM

## 2020-01-22 ENCOUNTER — Encounter: Admit: 2020-01-22 | Discharge: 2020-01-22 | Payer: MEDICAID | Primary: Family

## 2020-01-22 NOTE — Telephone Encounter
Spoke with pt to confirm fu and CT appts.

## 2020-01-26 ENCOUNTER — Encounter: Admit: 2020-01-26 | Discharge: 2020-01-26 | Payer: MEDICAID | Primary: Family

## 2020-01-26 ENCOUNTER — Ambulatory Visit: Admit: 2020-01-26 | Discharge: 2020-01-26 | Payer: MEDICAID | Primary: Family

## 2020-01-26 DIAGNOSIS — K7469 Other cirrhosis of liver: Secondary | ICD-10-CM

## 2020-01-26 DIAGNOSIS — I10 Essential (primary) hypertension: Secondary | ICD-10-CM

## 2020-01-26 LAB — CBC AND DIFF
Lab: 0.1 10*3/uL (ref 0–0.45)
Lab: 0.4 10*3/uL (ref 0–0.80)
Lab: 1.1 10*3/uL (ref 1.0–4.8)
Lab: 113 K/UL — ABNORMAL LOW (ref 150–400)
Lab: 13 % (ref 11–15)
Lab: 15 g/dL (ref 13.5–16.5)
Lab: 2 % (ref 0–2)
Lab: 20 % — ABNORMAL LOW (ref >60)
Lab: 3 % (ref 0–5)
Lab: 3.7 10*3/uL (ref 1.8–7.0)
Lab: 32 pg (ref 26–34)
Lab: 34 g/dL (ref 32.0–36.0)
Lab: 4.8 M/UL (ref 4.4–5.5)
Lab: 45 % (ref 40–50)
Lab: 5.6 K/UL (ref 4.5–11.0)
Lab: 67 % (ref >60)
Lab: 8 % (ref 4–12)
Lab: 9.3 FL (ref 7–11)
Lab: 92 FL (ref 80–100)

## 2020-01-26 LAB — COMPREHENSIVE METABOLIC PANEL
Lab: 107 mg/dL — ABNORMAL HIGH (ref 70–100)
Lab: 139 MMOL/L (ref 137–147)
Lab: 4.5 MMOL/L (ref 3.5–5.1)

## 2020-01-26 LAB — 25-OH VITAMIN D (D2 + D3): Lab: 29 ng/mL — ABNORMAL LOW (ref 30–80)

## 2020-01-26 LAB — PROTIME INR (PT): Lab: 1.2 MMOL/L (ref 0.8–1.2)

## 2020-01-26 LAB — POC CREATININE, RAD: Lab: 1 mg/dL (ref 0.4–1.24)

## 2020-01-26 MED ORDER — SODIUM CHLORIDE 0.9 % IJ SOLN
50 mL | Freq: Once | INTRAVENOUS | 0 refills | Status: CP
Start: 2020-01-26 — End: ?

## 2020-01-26 MED ORDER — IOHEXOL 350 MG IODINE/ML IV SOLN
80 mL | Freq: Once | INTRAVENOUS | 0 refills | Status: CP
Start: 2020-01-26 — End: ?

## 2020-02-04 ENCOUNTER — Encounter: Admit: 2020-02-04 | Discharge: 2020-02-04 | Payer: MEDICAID | Primary: Family

## 2020-02-15 IMAGING — CR UP_EXM
2 series · 2 of 2 positions shown · non-contrast
Comparison: none

[wrist pa]
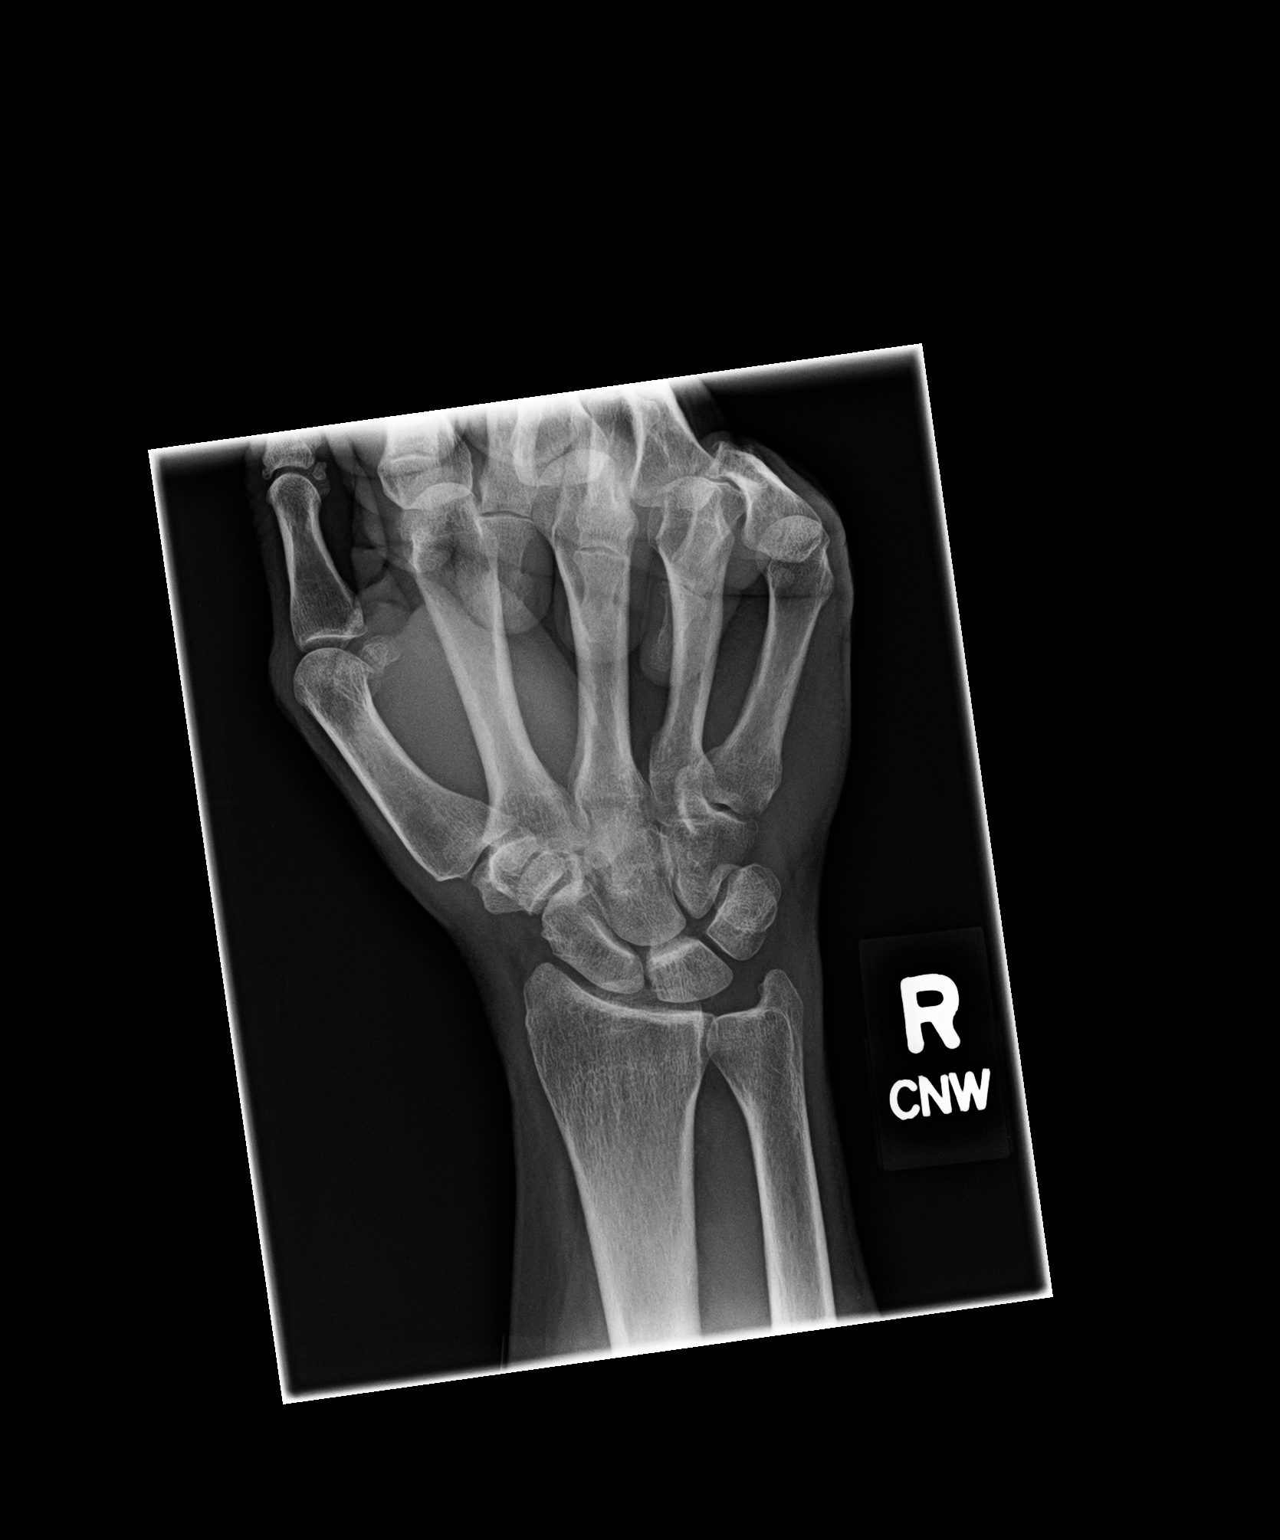

[wrist lat]
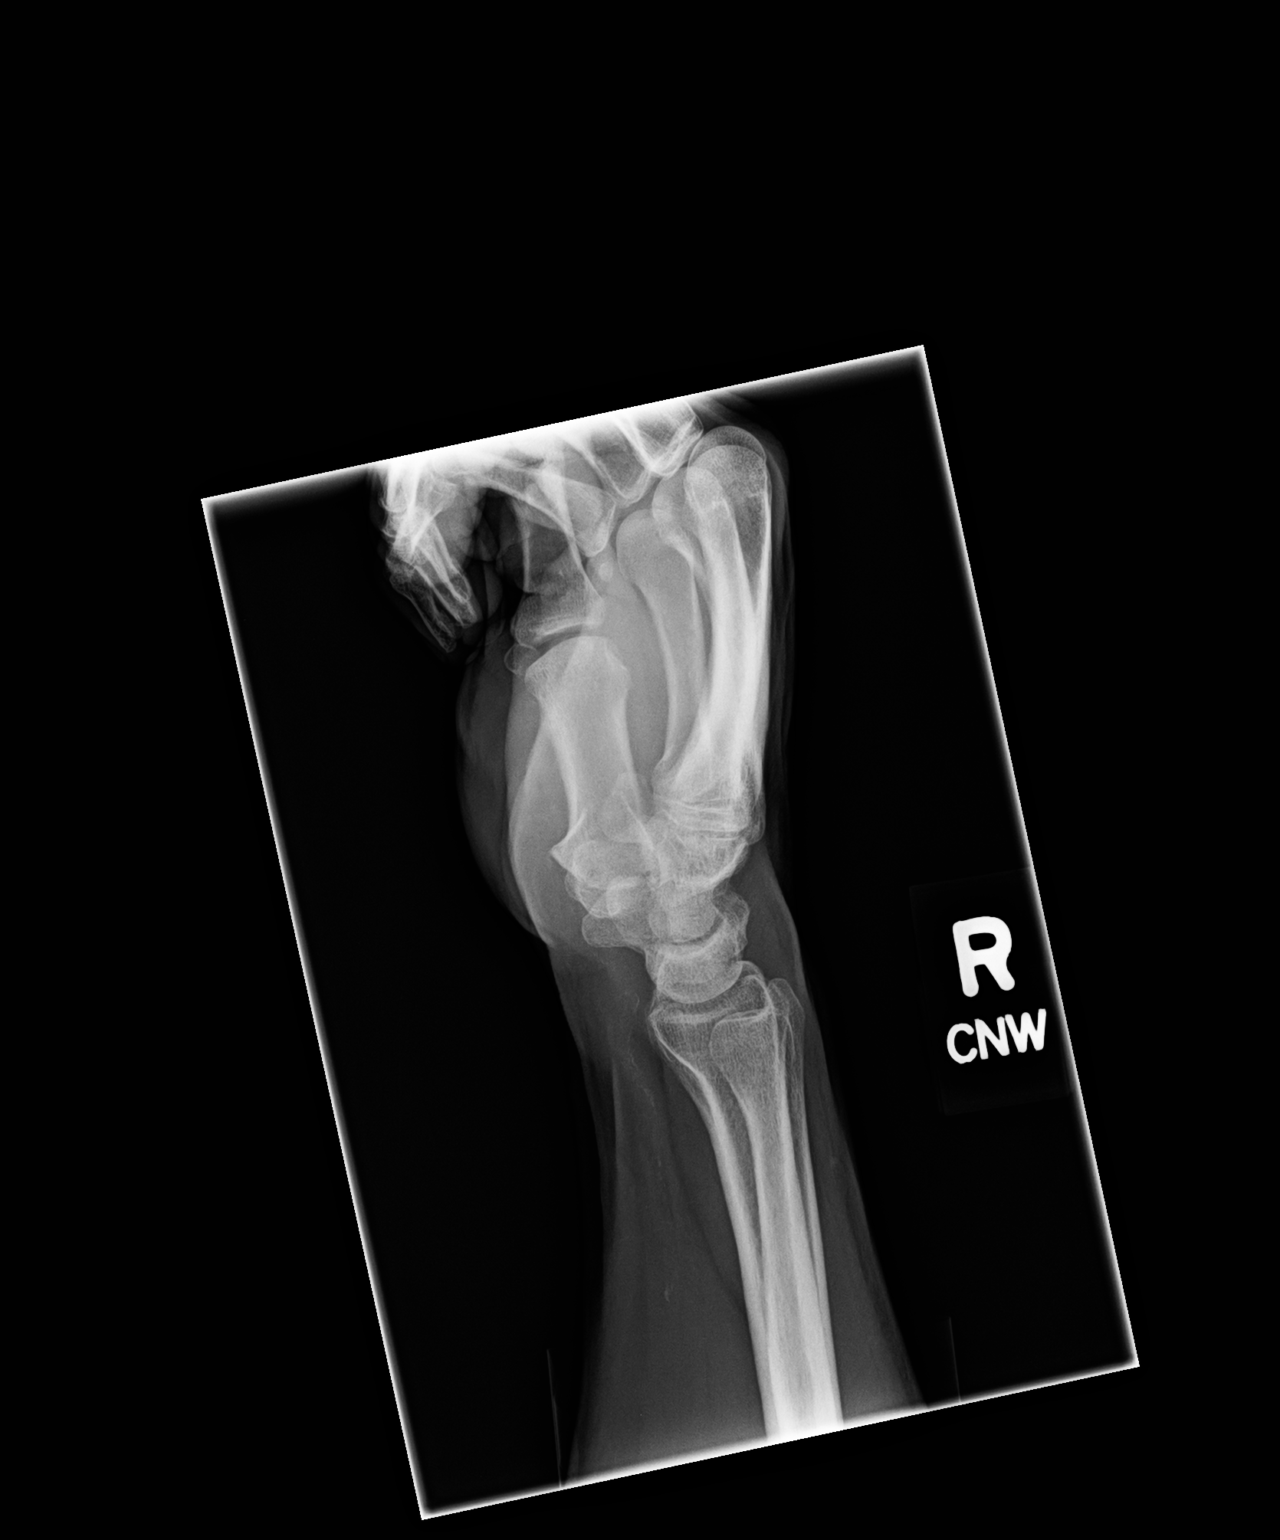

[2 of 2 positions shown; findings below may reference images not displayed]

EXAM

RADIOLOGIC EXAMINATION, WRIST, 2 VIEWS, CPT 24977

INDICATION

Status post fall. Injury. Patient states he fell yesterday and injured left wrist and has left
wrist pain but does not complain of right wrist pain. CNW/HB

TECHNIQUE

2 views of the right wrist were acquired.

COMPARISONS

Comparison is made with images of the contralateral right wrist.

FINDINGS

There are no fractures or subluxations identified in the right wrist. There are no abnormal masses
or calcifications. There are no blastic or lytic lesions. There are mild degenerative changes of the
triscaphe joint.

IMPRESSION

There are no acute radiographic abnormalities of the right wrist.

Tech Notes:

PT states he fell yesterday and injured LT wrist and has LT wrist pain but does not complain of RT
wrist pain. CNW/HB

## 2020-02-15 IMAGING — CR UP_EXM
2 series · 2 of 2 positions shown · non-contrast
Comparison: none

[wrist pa]
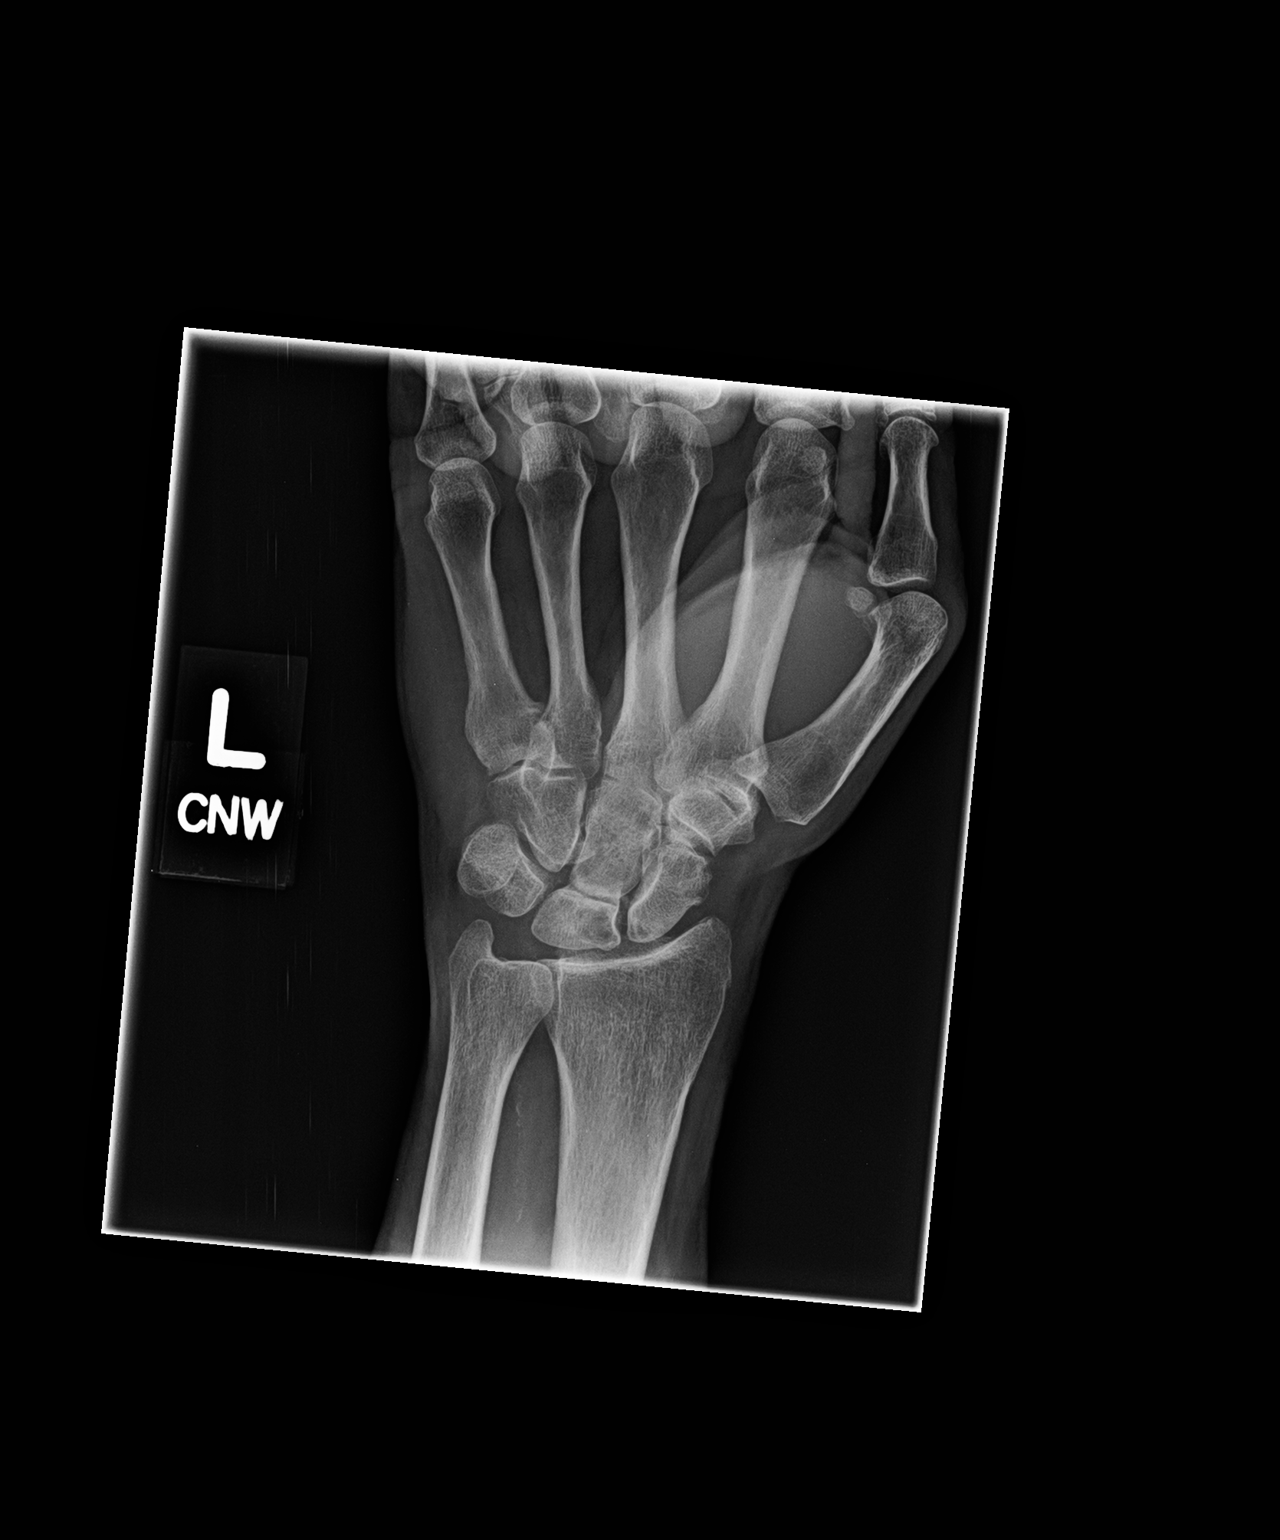

[wrist lat]
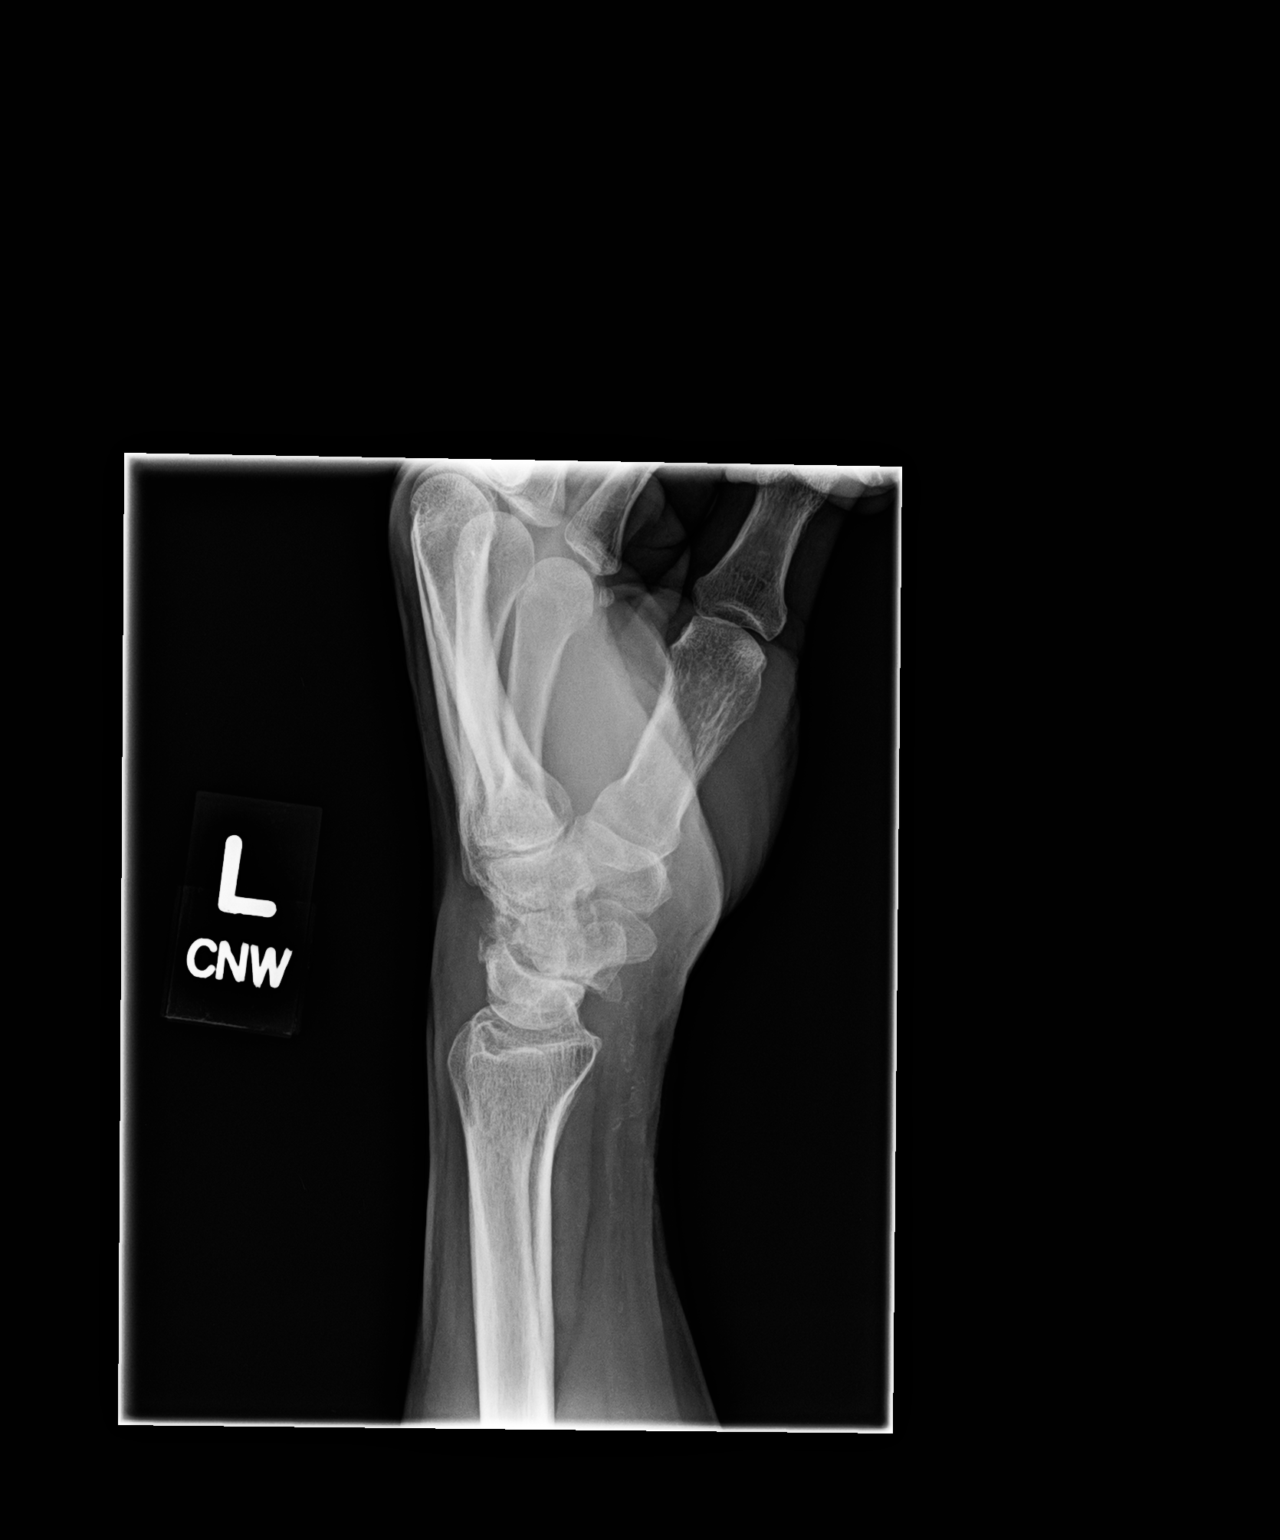

[2 of 2 positions shown; findings below may reference images not displayed]

EXAM

RADIOLOGIC EXAMINATION, WRIST, 2 VIEWS, CPT 28611

INDICATION

W19.XXXA. Patient complains of left wrist pain x 1 day. Patient states his pain is on the lateral
carpal area, near scaphoid. Patient states he fell and hit his wrist on concrete yesterday. CNW/HB

TECHNIQUE

2 views of the left wrist were acquired.

COMPARISONS

There are no previous examinations available for comparison at the time of dictation.

FINDINGS

Degenerative changes of the scaphoid lunate joint and triscaphe joint are noted. Lateral view
demonstrates a triquetrum fracture. There is overlying soft tissue swelling.

Cortical irregularity is seen along the radial aspect of the scaphoid carpal bone. Oblique and
scaphoid views may be of additional diagnostic value.

IMPRESSION

Triquetrum fracture is seen on the lateral view with overlying soft tissue swelling. There is
cortical irregularity along the radial aspect of the scaphoid bone. Oblique and scaphoid views would
be of additional diagnostic value.

Tech Notes:

PT c/o LT wrist pain x 1 day. PT states his pain is on the lateral carpal area, near scaphoid. PT
states he fell and hit his wrist on concrete yesterday. CNW/HB

## 2020-03-03 ENCOUNTER — Encounter: Admit: 2020-03-03 | Discharge: 2020-03-03 | Payer: MEDICAID | Primary: Family

## 2020-04-24 ENCOUNTER — Encounter: Admit: 2020-04-24 | Discharge: 2020-04-24 | Payer: MEDICAID | Primary: Family

## 2020-04-24 DIAGNOSIS — Z87898 Personal history of other specified conditions: Secondary | ICD-10-CM

## 2020-04-24 MED ORDER — AMILORIDE 5 MG PO TAB
ORAL_TABLET | Freq: Every day | 1 refills
Start: 2020-04-24 — End: ?

## 2020-05-19 ENCOUNTER — Encounter: Admit: 2020-05-19 | Discharge: 2020-05-19 | Payer: MEDICAID | Primary: Family

## 2020-05-19 NOTE — Telephone Encounter
This encounter was created in error. Please disregard. 

## 2020-06-04 ENCOUNTER — Encounter: Admit: 2020-06-04 | Discharge: 2020-06-04 | Payer: MEDICAID | Primary: Family

## 2020-06-04 NOTE — Telephone Encounter
Called patient to request records prior to upcomming appointment with Dr. Waxman. They have no records from an outside ENT provider or had any recent imaging of their head.

## 2020-06-09 ENCOUNTER — Encounter: Admit: 2020-06-09 | Discharge: 2020-06-09 | Payer: MEDICAID | Primary: Family

## 2020-06-09 ENCOUNTER — Ambulatory Visit: Admit: 2020-06-09 | Discharge: 2020-06-09 | Payer: MEDICAID | Primary: Family

## 2020-06-09 ENCOUNTER — Ambulatory Visit: Admit: 2020-06-09 | Discharge: 2020-06-10 | Payer: MEDICAID | Primary: Family

## 2020-06-09 DIAGNOSIS — I10 Essential (primary) hypertension: Secondary | ICD-10-CM

## 2020-06-09 DIAGNOSIS — H903 Sensorineural hearing loss, bilateral: Secondary | ICD-10-CM

## 2020-06-09 NOTE — Patient Instructions
Hearing Aid consult at [facility]

## 2020-06-09 NOTE — Progress Notes
Date of Service: 06/09/2020    Subjective:             Tyler Wolfe is a 64 y.o. male.    History of Present Illness  He has had progressive hearing loss.  He is having more difficulty with mixed noise and certain tones.  He has constant tinnitus.  No disequilibrium.  No significant infection history or noise exposure.  No significant family history.     Review of Systems   Constitutional: Negative.    HENT: Positive for hearing loss (hearing aids).    Eyes: Negative.    Respiratory: Negative.    Cardiovascular: Negative.    Gastrointestinal: Negative.    Endocrine: Negative.    Genitourinary: Negative.    Musculoskeletal: Negative.    Skin: Negative.    Allergic/Immunologic: Negative.    Neurological: Negative.    Hematological: Negative.    Psychiatric/Behavioral: Negative.          Objective:         ? aMILoride (MIDAMOR) 5 mg tablet TAKE 1 TABLET BY MOUTH EVERY DAY   ? epinephrine(+) (EPIPEN JR) 0.15 mg/0.3 mL (1:2,000) syringe Inject 0.15 mg to area(s) as directed as Needed.   ? furosemide (LASIX) 40 mg tablet Take 40 mg by mouth every morning.   ? vitamin A 10,000 unit capsule Take 1 tablet by mouth daily.     Vitals:    06/09/20 1153   Height: 179.1 cm (70.5)   PainSc: Zero     Body mass index is 32.82 kg/m?Marland Kitchen     Physical Exam     Constitutional: He appears well-developed and well-nourished.   HENT:   Normal voice  Head: Normocephalic and atraumatic.   Right Ear: Tympanic membrane, external ear and ear canal normal. No drainage, swelling or tenderness. No middle ear effusion.   Left Ear: Tympanic membrane, external ear and ear canal normal. No drainage, swelling or tenderness.  No middle ear effusion.   Eyes: EOM and lids are normal.   Neck: Trachea normal, normal range of motion and phonation normal. No thyromegaly present.   No adenopathy.  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  He is having more difficulty over the past several years.  No significant otologic history.  He has a bilateral sensory hearing loss and would benefit from amplification.  Appointment will be set up for that.

## 2020-07-16 ENCOUNTER — Encounter: Admit: 2020-07-16 | Discharge: 2020-07-16 | Payer: MEDICAID | Primary: Family

## 2020-07-16 NOTE — Telephone Encounter
Spoke with pt and appt confirmed for 07/20/2020 at 9:30am with Czarina Morley,APRN and [place] prior, NPO 6 hrs prior.

## 2020-07-20 ENCOUNTER — Ambulatory Visit: Admit: 2020-07-20 | Discharge: 2020-07-20 | Payer: MEDICAID | Primary: Family

## 2020-07-20 ENCOUNTER — Encounter: Admit: 2020-07-20 | Discharge: 2020-07-20 | Payer: MEDICAID | Primary: Family

## 2020-07-20 DIAGNOSIS — E559 Vitamin D deficiency, unspecified: Secondary | ICD-10-CM

## 2020-07-20 DIAGNOSIS — B182 Chronic viral hepatitis C: Secondary | ICD-10-CM

## 2020-07-20 DIAGNOSIS — K7469 Other cirrhosis of liver: Secondary | ICD-10-CM

## 2020-07-20 DIAGNOSIS — I8510 Secondary esophageal varices without bleeding: Secondary | ICD-10-CM

## 2020-07-20 DIAGNOSIS — I10 Essential (primary) hypertension: Secondary | ICD-10-CM

## 2020-07-20 LAB — COMPREHENSIVE METABOLIC PANEL
Lab: 142 MMOL/L (ref 137–147)
Lab: 5 MMOL/L (ref 3.5–5.1)

## 2020-07-20 LAB — CBC AND DIFF
Lab: 0 10*3/uL (ref 0–0.20)
Lab: 0.1 10*3/uL (ref 0–0.45)
Lab: 0.5 10*3/uL (ref 0–0.80)
Lab: 1 % (ref >60)
Lab: 1 10*3/uL (ref 1.0–4.8)
Lab: 110 10*3/uL — ABNORMAL LOW (ref 150–400)
Lab: 14 % (ref 11–15)
Lab: 14 g/dL (ref 13.5–16.5)
Lab: 18 % — ABNORMAL LOW (ref 24–44)
Lab: 3 % (ref >60)
Lab: 32 pg (ref 26–34)
Lab: 34 g/dL (ref 32.0–36.0)
Lab: 4.3 10*3/uL (ref 1.8–7.0)
Lab: 4.4 M/UL (ref 4.4–5.5)
Lab: 41 % (ref 40–50)
Lab: 6.2 10*3/uL (ref 4.5–11.0)
Lab: 70 % (ref 41–77)
Lab: 8 % (ref 4–12)
Lab: 9.1 FL (ref 7–11)
Lab: 94 FL (ref 80–100)

## 2020-07-20 LAB — ALPHA FETO PROTEIN (AFP): Lab: 5.9 ng/mL (ref 0.0–15.0)

## 2020-07-20 LAB — 25-OH VITAMIN D (D2 + D3): Lab: 27 ng/mL — ABNORMAL LOW (ref 30–80)

## 2020-07-20 LAB — PROTIME INR (PT): Lab: 1.1 (ref 0.8–1.2)

## 2020-07-22 ENCOUNTER — Encounter: Admit: 2020-07-22 | Discharge: 2020-07-22 | Payer: MEDICAID | Primary: Family

## 2020-07-22 DIAGNOSIS — B182 Chronic viral hepatitis C: Secondary | ICD-10-CM

## 2020-07-22 MED ORDER — ERGOCALCIFEROL (VITAMIN D2) 1,250 MCG (50,000 UNIT) PO CAP
1 | ORAL_CAPSULE | ORAL | 0 refills | 56.00000 days | Status: AC
Start: 2020-07-22 — End: ?

## 2020-07-30 ENCOUNTER — Encounter: Admit: 2020-07-30 | Discharge: 2020-07-30 | Payer: MEDICAID | Primary: Family

## 2020-07-30 ENCOUNTER — Ambulatory Visit: Admit: 2020-07-30 | Discharge: 2020-07-30 | Payer: MEDICAID | Primary: Family

## 2020-07-30 DIAGNOSIS — H903 Sensorineural hearing loss, bilateral: Secondary | ICD-10-CM

## 2020-07-30 NOTE — Progress Notes
[name] was seen today for a hearing aid consult.  He is interested in obtaining a hearing aid through his [place] Medicaid community plan medical card.     The patient does not meet the following criteria for binaural hearing aids.    Child under the age of 38    Legally Blind    Previous Binural user    Occupational requirement for binaural listening     Resound (color: graphite grey) for his left ear will be ordered. An earmold impression was taken with no difficulty.  He is scheduled for a fitting on 08/14/2019.

## 2020-08-10 ENCOUNTER — Encounter: Admit: 2020-08-10 | Discharge: 2020-08-10 | Payer: MEDICAID | Primary: Family

## 2020-08-17 ENCOUNTER — Encounter: Admit: 2020-08-17 | Discharge: 2020-08-17 | Payer: MEDICAID | Primary: Family

## 2020-08-17 NOTE — Telephone Encounter
Returned [employer] call regarding his newly prescribed medication concerns. Voicemail box not set up. Unable to leave a message.

## 2020-08-17 NOTE — Telephone Encounter
Richardson Landry returned your call.  Asked that you call him as early as possible tomorrow morning.  Meds are arriving tomorrow and he needs to discuss.

## 2020-08-18 ENCOUNTER — Encounter: Admit: 2020-08-18 | Discharge: 2020-08-18 | Payer: MEDICAID | Primary: Family

## 2020-08-18 NOTE — Telephone Encounter
Returned call and spoke with Tyler Wolfe about his concerns. Pt is starting a new drug today and was told to run it by our office. 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 This RN said she would call him by the end of the day to discuss concern after she evaluates drug concern.

## 2020-08-27 ENCOUNTER — Encounter: Admit: 2020-08-27 | Discharge: 2020-08-27 | Payer: MEDICAID | Primary: Family

## 2020-08-27 ENCOUNTER — Ambulatory Visit: Admit: 2020-08-27 | Discharge: 2020-08-27 | Payer: MEDICAID | Primary: Family

## 2020-08-27 DIAGNOSIS — H903 Sensorineural hearing loss, bilateral: Secondary | ICD-10-CM

## 2020-08-27 NOTE — Progress Notes
Tyler Wolfe was seen for a hearing aid check. We got his earmold remade to a micormold. He was satisfied with the fit of the earmold. He will return as needed.     Manufacturer:Resound  Model: Key 4  Serial Numbers: [id number]     Receiver Length: size 2  Receiver Strength: MP  Repair Warranty: 09/02/2023  L&D Warranty: 09/02/2023  Battery: 312  Earmold: Resound micromold  Fit Date: 08/13/2020  Purchase Type: [place] medicaid

## 2020-08-31 ENCOUNTER — Encounter: Admit: 2020-08-31 | Discharge: 2020-08-31 | Payer: MEDICAID | Primary: Family

## 2020-10-05 ENCOUNTER — Encounter: Admit: 2020-10-05 | Discharge: 2020-10-05 | Payer: MEDICAID | Primary: Family

## 2020-10-05 MED ORDER — ERGOCALCIFEROL (VITAMIN D2) 1,250 MCG (50,000 UNIT) PO CAP
ORAL_CAPSULE | ORAL | 2 refills | 56.00000 days | Status: AC
Start: 2020-10-05 — End: ?

## 2020-10-14 ENCOUNTER — Encounter: Admit: 2020-10-14 | Discharge: 2020-10-14 | Payer: MEDICAID | Primary: Family

## 2020-10-14 DIAGNOSIS — Z87898 Personal history of other specified conditions: Secondary | ICD-10-CM

## 2020-10-14 MED ORDER — AMILORIDE 5 MG PO TAB
ORAL_TABLET | Freq: Every day | ORAL | 1 refills | 30.00000 days | Status: AC
Start: 2020-10-14 — End: ?

## 2020-12-15 ENCOUNTER — Encounter: Admit: 2020-12-15 | Discharge: 2020-12-15 | Payer: MEDICAID | Primary: Family

## 2020-12-15 MED ORDER — ERGOCALCIFEROL (VITAMIN D2) 1,250 MCG (50,000 UNIT) PO CAP
ORAL_CAPSULE | ORAL | 2 refills | 56.00000 days | Status: AC
Start: 2020-12-15 — End: ?

## 2021-01-18 ENCOUNTER — Encounter: Admit: 2021-01-18 | Discharge: 2021-01-18 | Payer: MEDICAID | Primary: Family

## 2021-01-18 ENCOUNTER — Ambulatory Visit: Admit: 2021-01-18 | Discharge: 2021-01-18 | Payer: MEDICAID | Primary: Family

## 2021-01-18 DIAGNOSIS — R600 Localized edema: Secondary | ICD-10-CM

## 2021-01-18 DIAGNOSIS — M8589 Other specified disorders of bone density and structure, multiple sites: Secondary | ICD-10-CM

## 2021-01-18 DIAGNOSIS — B182 Chronic viral hepatitis C: Secondary | ICD-10-CM

## 2021-01-18 DIAGNOSIS — Z0000 Encounter for general adult medical examination without abnormal findings: Secondary | ICD-10-CM

## 2021-01-18 DIAGNOSIS — E559 Vitamin D deficiency, unspecified: Secondary | ICD-10-CM

## 2021-01-18 DIAGNOSIS — T148XXA Other injury of unspecified body region, initial encounter: Secondary | ICD-10-CM

## 2021-01-18 DIAGNOSIS — I8510 Secondary esophageal varices without bleeding: Secondary | ICD-10-CM

## 2021-01-18 DIAGNOSIS — K7469 Other cirrhosis of liver: Secondary | ICD-10-CM

## 2021-01-18 DIAGNOSIS — K6389 Other specified diseases of intestine: Secondary | ICD-10-CM

## 2021-01-18 DIAGNOSIS — F17210 Nicotine dependence, cigarettes, uncomplicated: Secondary | ICD-10-CM

## 2021-01-18 DIAGNOSIS — I10 Essential (primary) hypertension: Secondary | ICD-10-CM

## 2021-01-18 DIAGNOSIS — Z13820 Encounter for screening for osteoporosis: Secondary | ICD-10-CM

## 2021-01-18 LAB — CBC AND DIFF
ABSOLUTE BASO COUNT: 0 K/UL (ref 0–0.20)
ABSOLUTE EOS COUNT: 0.1 K/UL (ref 0–0.45)
ABSOLUTE LYMPH COUNT: 1 K/UL (ref 1.0–4.8)
ABSOLUTE MONO COUNT: 0.5 K/UL (ref 0–0.80)
ABSOLUTE NEUTROPHIL: 5.3 K/UL (ref 1.8–7.0)
BASOPHILS %: 1 % (ref 0–2)
EOSINOPHILS %: 2 % (ref 0–5)
LYMPHOCYTES %: 15 % — ABNORMAL LOW (ref 24–44)
MCH: 32 pg (ref 26–34)
MPV: 9.3 FL (ref 7–11)
NEUTROPHILS %: 74 % (ref 41–77)
RBC COUNT: 4.7 M/UL (ref 4.4–5.5)
RDW: 13 % (ref 11–15)
WBC COUNT: 7.2 K/UL (ref 4.5–11.0)

## 2021-01-18 LAB — COMPREHENSIVE METABOLIC PANEL
ALBUMIN: 4.4 g/dL (ref 3.5–5.0)
AST: 21 U/L — ABNORMAL LOW (ref 7–40)
CALCIUM: 9.7 mg/dL (ref 8.5–10.6)
CHLORIDE: 103 MMOL/L (ref 98–110)
CREATININE: 1 mg/dL (ref 0.4–1.24)
EGFR: >60 mL/min (ref >60)
POTASSIUM: 4.7 MMOL/L (ref 3.5–5.1)
SODIUM: 138 MMOL/L (ref 137–147)
TOTAL PROTEIN: 7.7 g/dL (ref 6.0–8.0)

## 2021-01-18 LAB — 25-OH VITAMIN D (D2 + D3): VITAMIN D (25-OH) TOTAL: 42 ng/mL (ref 30–80)

## 2021-01-18 LAB — PROTIME INR (PT)
INR: 1.1 (ref 0.8–1.2)
PROTIME: 13 s (ref 9.5–14.2)

## 2021-01-18 LAB — ALPHA FETO PROTEIN (AFP): AFP: 5.7 ng/mL (ref 0.0–15.0)

## 2021-01-18 NOTE — Progress Notes
Date of Service: 01/18/2021     Subjective:             Tyler Wolfe is a 65 y.o. male.    History of Present Illness  [name] is a 65 year old male who presents today for a 6 month follow up.  As you know, 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  He is known to have evidence of decompensated disease with ascites/edema and EV, well managed at this time.  Comorbidities for [name] are limited, but include HTN.    Interval History:  Since last being seen, [name] reports doing well in regards to his liver disease.  He reports no new symptoms or concerns.  He states his previous reports of a diffuse rash progressed since our last visit, extending from his legs to his wrists/arms and back.  He was evaluated locally and felt it developed cellulitis and was treated with antibiotics.  He reports clearance of the infection, although he still has some areas on his back with the rash (lichens planus).  He does have plans for a dermatology visit here at [place] later this year as well.  He otherwise reports no changes in his health.  He reports no hospitalizations or ER visits.    In regards to [name] liver disease, we are aware he has evidence of decompensation with a history of ascites/edema and EV.  His fluid status remains well managed on current diuretics (Amiloride 5mg  daily and Lasix 40mg  daily).  He admits 1-2 times a month he notices increased swelling in his legs at the end of the evening and will occasionally take an additional 20mg  of Lasix.  He reports this is not a normal occurrence.  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  He continues to report no symptoms to suggest HE, jaundice, pruritus, or episodes of GIB.  Today, [name] reports no abdominal pain, nausea, vomiting, hematemesis, diarrhea, melena, hematochezia, fevers, chills, fatigue, or unexpected weight loss.      As for his comorbiditities, we find he continues to follow closely with his PCP for management.  Today, he reports no chest pain, palpitations or SOB.    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  He states he is now 5 years from his last drink of alcohol.  He is back to smoking cigarettes, at most 3 packs a week.  He presents alone today    Health Maintenance:  Colonoscopy-  2020  EGD-  2021  Influenza-  2020  COVID (Moderna x 3)- 2021  BMD- January 2020         Review of Systems  A complete 10 point review of systems was negative except those listed in the HPI.    Objective:         Your Current Medications:       Instructions    aMILoride (MIDAMOR) 5 mg tablet TAKE 1 TABLET BY MOUTH EVERY DAY    epinephrine(+) (EPIPEN JR) 0.15 mg/0.3 mL (1:2,000) syringe Inject 0.15 mg to area(s) as directed as Needed.  ergocalciferol (vitamin D2) (DRISDOL) 1,250 mcg (50,000 unit) capsule TAKE 1 CAPSULE BY MOUTH EVERY 7 DAYS FOR 12 DOSES.    furosemide (LASIX) 40 mg tablet Take 40 mg by mouth every morning.          Vitals:    01/18/21 0846   BP: 129/84   BP Source: Arm, Right Upper   Pulse: 81   Temp: 36.8 ?C (98.2 ?F)   SpO2: 96%   TempSrc: Oral   PainSc: Eight   Weight: 103.7 kg (228 lb 9.6 oz)   Height: 180.3 cm (5' 11)     Body mass index is 31.88 kg/m?Marland Kitchen     Physical Exam  Vitals reviewed.   Constitutional: Patient appears energized, well-developed, well-nourished, in no apparent distress.  Responds appropriately to questions.  Neuro:  No tremor noted.  Patient is AOx4.  No asterixis.  HEENT:  Head is normocephalic and atraumatic.  No scleral icterus noted.  Conjunctiva pink and moist.  EOM normal & PERRLA.   Neck and Lymph: Normal ROM. Neck supple. No thyromegaly. No lymphadenopathy.  No JVD  Cardiac:  S1 and S2  Respiratory:  lung sounds clear to auscultation bilaterally.  No wheezes or crackles.  GI:  Abdomen soft, round, non-distended, non-tender. BS present. No clinically evident ascites. No hernia.  +splenomegaly.     Skin:  Skin is warm, dry, and intact. Areas of lichens planus on lower back. Nails show no clubbing. No jaundice.  Few spider nevi on upper torso.  +palmar erythema   Peripheral Vascular: No lower extremity edema.  Musculoskeletal:  ROM intact.    Psychiatric: Normal mood and affect. Behavior is normal. Judgment and thought content normal         Labs:  CBC, CMP, INR from 07/20/2020 reviewed in O2    MELD-Na score: 7 at 07/20/2020 10:41 AM  MELD score: 7 at 07/20/2020 10:41 AM  Calculated from:  Serum Creatinine: 0.77 MG/DL (Using min of 1 MG/DL) at 16/09/958 45:40 AM  Serum Sodium: 142 MMOL/L (Using max of 137 MMOL/L) at 07/20/2020 10:41 AM  Total Bilirubin: 0.9 MG/DL (Using min of 1 MG/DL) at 98/09/1912 78:29 AM  INR(ratio): 1.1 at 07/20/2020 10:41 AM  Age: 57 years     08/20/2017 11:12   Hepatitis A IGG POS...   Anti HBs 209.9   Hepatitis C PCR Quantitative HCV RNA Not Detected, <12 IU/mL   Log 10 HCV <1.08      07/20/2020 10:41   Vitamin D(25-OH)Total 27.7 (L)   Alpha Feto Protein 5.9     Diagnostic tests:    EGD (May 2021):  Report in O2 and reviewed    Colonoscopy (January 2020)  Report scanned in O2 and reviewed    Imaging:  Abdominal US (07/20/2020):  IMPRESSION       1. ?US-1, negative. Visualization score A.   2. 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 No   ascites.   3. ?Hepatic vasculature is patent with normal direction of flow.   4. ?Cholelithiasis.     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  He is known to have evidence of decompensation including ascites/edema and EV  - We find his most recent MELD stable at 7.  Discussed that given his low MELD and continued clinical stability, a liver transplant evaluation is  not yet indicated.  We will continue to monitor for changes that may indicate otherwise.  - For now, we will continue to manage complications related to his liver disease accordingly.    - As for NAFLD RF, we again reviewed importance of diet and weight loss.  Consider the utility of liberalizing coffee and black tea consumption as some data that this may slow progression and reverse effects of NASH-related fibrosis.  HTN/HLP management encouraged as well.  - Again reviewed all alcohol use must be avoided.  This includes all NA beer and wine as well.  Random screens will continue  - MELD labs to be obtained     [facility] Screening:  - Secondary to cirrhosis, he is at risk for developing HCC.  As a result, we will proceed with HCC screening.  - Recommend screening for [facility] with either abdominal ultrasound or alternating abdominal ultrasound with a triple phase CT of the abdomen with IV contrast OR MRI of the abdomen with IV contrast every 6 months.  - Last abdominal imaging was done November 2021.  CT was scheduled today, however canceled.  We will arrange for an [place] now and await results  - AFP needed every 6 months to be done at time of imaging screen.  AFP with labs today  - Reminded [name] that should he develop any new onset, unexpected weight loss, he is to notify the office for review.    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  He was referred to [facility] with a negative workup.  For now, will continue to monitor for changes    HCV  - [name] is known to have had HCV, treated at an outside hospital with SVR obtained.   - Reminded [name] that although he has cleared the virus, he is still at risk for reinfection should he partake in any high risk activity.  He verbalizes understanding    Hepatic encephalopathy:   - Today, he continues to report no signs of HE.  No interventions needed for now.  - Reminded him to be mindful for any signs of confusion/fogginess in thinking, worsening malaise, or tremors that may suggest the development of HE.  Should this occur, he is to notify the office for review.    Esophageal Varices:  - Last EGD was done May 2021. Repeat EGD recommended now for screening as he was found to have small EV with his last EGD.  Orders placed and will be completed here.  - If there is any evidence of medium/large esophageal varices, we would recommend esophageal varix band ligation.  If band ligation is performed, please continue to repeat (~ every 4-6 weeks) until eradication of varices.  - Reminded [name] that should he have any episodes of hematemesis or melena, he should proceed to the ER immediately.    Ascites/Edema:   - Currently controlled with use of diuretics; lasix 40mg  and amiloride 5mg  daily.  No adjustments needed.  - Encouraged him to continue with a low sodium diet <2 grams/day, with high amounts of lean protein (see below for details).  - Should he have any new onset ascites, edema, or weight gain, he is to notify the office for review.    - Reminded [name] that should he have any new onset fevers or chills, sudden sharp abdominal pain or discomfort, he should proceed to the ER to be evaluated for possible SBP.      History  of alcohol abuse:    - Emphasized the need to stay abstinent from any alcohol.  Discussed this includes all NA beer and wine  - We will continue with random screening to ensure compliance    Smoking  - Discussed importance of smoking cessation.  Reviewed potential risks and complications related to ongoing cigarette use.  He verbalizes understanding of risks and will work on cessation again.  - Reviewed that if a liver transplant is needed in the future, he must be free of all nicottine    Protein Malnutrition:  - For patients with cirrhosis, it is very important to eat the right types and amounts of foods.  We recommend a diet low in carbohydrates/sugars and high in fresh fruits/vegetables, with the lean proteins; such as eggs, fish, chicken, nuts, and legumes.  We typically recommend 1.2-1.5gm/kg/day of protein, or 75-100 grams of protein every day.    - Discussed the need to minimize the intake of carbohydrates and sugars as well to avoid obesity.    - [name] should eat at least three meals a day and three to four snacks between meals  - Bedtime snacks are especially important (preferably something with some protein)    - Patients with malnutrition and/or loss of muscular mass can improve their nutrition and muscular mass by drinking two cans of dietary supplements daily, particularly at bedtime.  These would include: Ensure, Boost, Carnation Instant Milk, Glucerna, (or similar supplements), if needed  - Please avoid eating raw seafood, especially shellfish, because of risk of serious illness    Bone Health/Vitamin D deficiency  - Secondary to cirrhosis, [name] is at risk for Vitamin D deficiency and developing osteopenia/osteoporosis.  We will continue to monitor for changes.  - Vitamin D with labs, interventions to follow.  - He does follow with endocrinology as well and we will follow along (Dr. Achille Wolfe).  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  Due to polyps (hepatic flexure TA, sigmoid TVA, and R-S TVA with HGD), recommend to repeat in 3 years (2023) or sooner if clinically indicated  - Routine screening for fat soluble vitamins encouraged yearly (Vitamin A, E) with supplementation and appropriate follow up if indicated.  Will obtain with follow up labs  - All patients with cirrhosis should avoid the use of Non-steroidal Anti-Inflammatory (NSAID) medications as they can cause significant injury to the kidneys in this population.  If needed, you may use Tylenol PRN, no more than 2g/day.  - Reminded Mr.Stokke that should he have any hospitalizations or ER visits, he is to notify the office as well.  - Discussed that if any elective surgeries are needed, we would recommend first reviewing with the clinic first, given his liver disease.    - COVID precautions reviewed and encouraged       Follow-up:  He is to return to [facility] in 6 months.  As always, he may come sooner if a problem arises.               I am grateful for the opportunity to participate in the care of this patient.  If you have any further questions, please don't hesitate to contact our office.      _____________________________  Christen Butter, APRN-C  Hepatology & Liver Transplantation  [facility]  [phone]    Total of 40 minutes were spent on the same day of the visit including preparing to see the patient, obtaining and reviewing separately obtained history, performing a medically appropriate examination and evaluation, counseling and educating the patient of the side effect profile of cirrhosis/pathophysiology of the underlying disease process and subsequent development of portal HTN and its sequale, ordering medications, tests, or procedures for ongoing management, referring and communication with other health care professionals, documenting clinical information in the electronic or other health record, interpreting results of labs and procedures ordered and communicating results to the patient, and care coordination.

## 2021-01-27 ENCOUNTER — Encounter: Admit: 2021-01-27 | Discharge: 2021-01-27 | Payer: MEDICAID | Primary: Family

## 2021-01-27 NOTE — Telephone Encounter
Spoke to [place] regarding CT that was cancelled due to contrast shortage. Tyler Wolfe verbalizes understanding. Tyler Wolfe to have ultrasound 02/14/2021. Tyler Wolfe verbalizes that [place] is not covered by insurance. This RN to follow up.

## 2021-01-27 NOTE — Telephone Encounter
Pt called saying he received a letter stating his insurance company was denying the CT that he was scheduled for. Pt said CT was canceled and another scan was to be scheduled. Pt is assuming that scan will be denied too. He is just wanting to make sure a PA is completed if needed prior to the appt date.

## 2021-02-14 ENCOUNTER — Encounter: Admit: 2021-02-14 | Discharge: 2021-02-14 | Payer: MEDICARE | Primary: Family

## 2021-02-14 ENCOUNTER — Ambulatory Visit: Admit: 2021-02-14 | Discharge: 2021-02-14 | Payer: MEDICARE | Primary: Family

## 2021-02-14 DIAGNOSIS — K7469 Other cirrhosis of liver: Secondary | ICD-10-CM

## 2021-02-14 DIAGNOSIS — Z0000 Encounter for general adult medical examination without abnormal findings: Secondary | ICD-10-CM

## 2021-02-14 DIAGNOSIS — B182 Chronic viral hepatitis C: Secondary | ICD-10-CM

## 2021-02-14 DIAGNOSIS — Z13820 Encounter for screening for osteoporosis: Secondary | ICD-10-CM

## 2021-02-14 DIAGNOSIS — T148XXA Other injury of unspecified body region, initial encounter: Secondary | ICD-10-CM

## 2021-02-14 DIAGNOSIS — M8589 Other specified disorders of bone density and structure, multiple sites: Secondary | ICD-10-CM

## 2021-02-15 ENCOUNTER — Encounter: Admit: 2021-02-15 | Discharge: 2021-02-15 | Payer: MEDICARE | Primary: Family

## 2021-02-16 ENCOUNTER — Encounter: Admit: 2021-02-16 | Discharge: 2021-02-16 | Payer: MEDICARE | Primary: Family

## 2021-02-16 DIAGNOSIS — M810 Age-related osteoporosis without current pathological fracture: Secondary | ICD-10-CM

## 2021-02-16 DIAGNOSIS — B182 Chronic viral hepatitis C: Secondary | ICD-10-CM

## 2021-02-22 ENCOUNTER — Encounter: Admit: 2021-02-22 | Discharge: 2021-02-22 | Payer: MEDICARE | Primary: Family

## 2021-02-22 DIAGNOSIS — K7460 Unspecified cirrhosis of liver: Secondary | ICD-10-CM

## 2021-03-22 ENCOUNTER — Encounter: Admit: 2021-03-22 | Discharge: 2021-03-22 | Payer: MEDICARE | Primary: Family

## 2021-03-22 NOTE — Telephone Encounter
Patient called wanting CT date changed. This RN called radiology and changed CT date. Patient given radiology number if he would like to reschedule it again.

## 2021-03-22 NOTE — Telephone Encounter
Pt called in regarding the CT that you scheduled for him. He has questions and would like a call back. Please return his call

## 2021-04-08 ENCOUNTER — Encounter: Admit: 2021-04-08 | Discharge: 2021-04-08 | Payer: MEDICARE | Primary: Family

## 2021-04-08 DIAGNOSIS — Z87898 Personal history of other specified conditions: Secondary | ICD-10-CM

## 2021-04-08 MED ORDER — AMILORIDE 5 MG PO TAB
ORAL_TABLET | Freq: Every day | ORAL | 1 refills | 90.00000 days | Status: AC
Start: 2021-04-08 — End: ?

## 2021-04-14 ENCOUNTER — Ambulatory Visit: Admit: 2021-04-14 | Discharge: 2021-04-14 | Payer: MEDICARE | Primary: Family

## 2021-04-14 ENCOUNTER — Encounter: Admit: 2021-04-14 | Discharge: 2021-04-14 | Payer: MEDICARE | Primary: Family

## 2021-04-14 DIAGNOSIS — B182 Chronic viral hepatitis C: Secondary | ICD-10-CM

## 2021-04-14 DIAGNOSIS — I8510 Secondary esophageal varices without bleeding: Secondary | ICD-10-CM

## 2021-04-14 DIAGNOSIS — Z0000 Encounter for general adult medical examination without abnormal findings: Secondary | ICD-10-CM

## 2021-04-14 DIAGNOSIS — K7469 Other cirrhosis of liver: Secondary | ICD-10-CM

## 2021-05-06 ENCOUNTER — Encounter: Admit: 2021-05-06 | Discharge: 2021-05-06 | Payer: MEDICARE | Primary: Family

## 2021-05-06 NOTE — Telephone Encounter
Pt called in with questions regarding his procedures and payments for them.

## 2021-05-09 ENCOUNTER — Encounter: Admit: 2021-05-09 | Discharge: 2021-05-09 | Payer: MEDICARE | Primary: Family

## 2021-06-29 ENCOUNTER — Encounter: Admit: 2021-06-29 | Discharge: 2021-06-29 | Payer: MEDICARE | Primary: Family

## 2021-07-07 ENCOUNTER — Encounter: Admit: 2021-07-07 | Discharge: 2021-07-07 | Payer: MEDICARE | Primary: Family

## 2021-07-25 ENCOUNTER — Encounter: Admit: 2021-07-25 | Discharge: 2021-07-25 | Payer: MEDICARE | Primary: Family

## 2021-07-25 NOTE — Telephone Encounter
Pt called to cancel and r/s his appt tomorrow w/ CM because of the weather tomorrow. Please call him back.

## 2021-07-25 NOTE — Telephone Encounter
Patient called to Schedule appointment call transferred to [facility] Thanks

## 2021-07-25 NOTE — Telephone Encounter
Pt called in to cancel his appointment for tomorrow and would like to reschedule it due to the weather tomorrow. He said that he has to drive 75 miles and it will be snowing at the time his appointment is scheduled. Please return pt's call.

## 2021-07-25 NOTE — Telephone Encounter
Spoke with patient. Rsch f/u with Cherie and coordinated lab appt.

## 2021-07-26 ENCOUNTER — Encounter: Admit: 2021-07-26 | Discharge: 2021-07-26 | Payer: MEDICARE | Primary: Family

## 2021-07-27 ENCOUNTER — Encounter: Admit: 2021-07-27 | Discharge: 2021-07-27 | Payer: MEDICARE | Primary: Family

## 2021-08-02 ENCOUNTER — Encounter: Admit: 2021-08-02 | Discharge: 2021-08-02 | Payer: MEDICARE | Primary: Family

## 2021-08-16 ENCOUNTER — Ambulatory Visit: Admit: 2021-08-16 | Discharge: 2021-08-16 | Payer: MEDICARE | Primary: Family

## 2021-08-16 ENCOUNTER — Encounter: Admit: 2021-08-16 | Discharge: 2021-08-16 | Payer: MEDICARE | Primary: Family

## 2021-08-16 DIAGNOSIS — B182 Chronic viral hepatitis C: Secondary | ICD-10-CM

## 2021-08-16 DIAGNOSIS — K7460 Unspecified cirrhosis of liver: Secondary | ICD-10-CM

## 2021-08-16 DIAGNOSIS — Z0000 Encounter for general adult medical examination without abnormal findings: Secondary | ICD-10-CM

## 2021-08-16 DIAGNOSIS — E559 Vitamin D deficiency, unspecified: Secondary | ICD-10-CM

## 2021-08-16 DIAGNOSIS — K6389 Other specified diseases of intestine: Secondary | ICD-10-CM

## 2021-08-16 DIAGNOSIS — R600 Localized edema: Secondary | ICD-10-CM

## 2021-08-16 DIAGNOSIS — I8510 Secondary esophageal varices without bleeding: Secondary | ICD-10-CM

## 2021-08-16 DIAGNOSIS — F17210 Nicotine dependence, cigarettes, uncomplicated: Secondary | ICD-10-CM

## 2021-08-16 DIAGNOSIS — I10 Essential (primary) hypertension: Secondary | ICD-10-CM

## 2021-08-16 DIAGNOSIS — E46 Unspecified protein-calorie malnutrition: Secondary | ICD-10-CM

## 2021-08-16 LAB — CBC AND DIFF
HEMATOCRIT: 44 % (ref 40–50)
HEMOGLOBIN: 15 g/dL (ref 13.5–16.5)
MCH: 32 pg (ref 26–34)
MCV: 93 FL (ref 80–100)
RBC COUNT: 4.7 M/UL (ref 4.4–5.5)
WBC COUNT: 6.4 K/UL (ref 4.5–11.0)

## 2021-08-16 LAB — 25-OH VITAMIN D (D2 + D3): VITAMIN D (25-OH) TOTAL: 20 ng/mL — ABNORMAL LOW (ref 30–80)

## 2021-08-16 LAB — ALPHA FETO PROTEIN (AFP): AFP: 6 ng/mL (ref 0.0–15.0)

## 2021-08-16 LAB — COMPREHENSIVE METABOLIC PANEL
ALBUMIN: 4.5 g/dL (ref 3.5–5.0)
ALK PHOSPHATASE: 52 U/L (ref 25–110)
ALT: 12 U/L (ref 7–56)
ANION GAP: 9 (ref 3–12)
CALCIUM: 9.7 mg/dL (ref 8.5–10.6)
CHLORIDE: 104 MMOL/L — ABNORMAL LOW (ref 98–110)
CO2: 27 MMOL/L (ref 21–30)
CREATININE: 0.9 mg/dL — ABNORMAL LOW (ref 0.4–1.24)
EGFR: >60 mL/min (ref >60)
POTASSIUM: 4.9 MMOL/L (ref 3.5–5.1)
SODIUM: 140 MMOL/L (ref 137–147)
TOTAL BILIRUBIN: 1 mg/dL (ref 0.3–1.2)

## 2021-08-16 LAB — PROTIME INR (PT)
INR: 1.1 mg/dL (ref 0.8–1.2)
PROTIME: 12 s — ABNORMAL HIGH (ref 9.5–14.2)

## 2021-08-16 NOTE — Progress Notes
Date of Service: 08/16/2021     Subjective:             Tyler Wolfe is a 65 y.o. male.    History of Present Illness  [name] is a 65 year old male who presents today for a 6 month follow up.  As you know, 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  He is known to have evidence of decompensated disease with ascites/edema and EV, well managed at this time.  Comorbidities for TylerTyler Wolfe are limited, but include HTN.    Interval History:  Since last being seen, [name] reports doing fair.  He admits he has been under additional stress as he is now helping care/monitor his younger brother who has schizophrenia and developmental delays.  He is having some troubles managing his needs.  He believes he needs to take action and work in getting legal rights to his care to help him better manage his MH.  In regards to his liver history, he states he does not have any specific questions or concerns.  He denies any hospitalizations or ER visits.    In regards to [name]'s liver disease, we are aware he has had evidence of decompensation with a history of ascites/edema and EV.  He reports his fluid status is managed with current diuretics (Amiloride 5mg  and Furosemide 40mg  daily).  He occasionally requires an additional 20mg  of lasix 1-2 times a month, but attributes that to diet changes.  He denies any episodes of hematemesis, melena, or hematochezia at this time.  He admits he is due for an EGD, however is having troubles with transportation.  Once he is able to establish transportation, he states he will arrange for an EGD.  He otherwise denies any jaundice, pruritus, or episodes of GIB.  He has updated imaging set up for later this month.  Today, [name] reports no abdominal pain, nausea, vomiting, hematemesis, diarrhea, melena, hematochezia, fevers, chills, fatigue, or unexpected weight loss.  He has joined a gym and hopes he will start a more scheduled routine.     As for his comorbiditities, we find he continues to follow closely with his PCP for management.  Today, he reports no chest pain, palpitations or SOB.    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  He states he is now 5 years from his last drink of alcohol.  He reports he no longer is smoking.  He presents alone today    Health Maintenance:  Colonoscopy-  2020  EGD-  2021  Influenza-  2022  COVID (Moderna x 3, Bivalent booster)- 2022  BMD- January 2020         Review of Systems  A complete 10 point review of systems was negative except those listed in the HPI.    Objective:         Your Current Medications:       Instructions    aMILoride (MIDAMOR) 5 mg tablet TAKE 1 TABLET BY MOUTH EVERY DAY    epinephrine(+) (EPIPEN JR) 0.15 mg/0.3 mL (1:2,000) syringe Inject 0.15 mg to area(s) as directed as Needed.    furosemide (LASIX) 40 mg tablet Take 40 mg by mouth  every morning.        Vitals:    08/16/21 0938   BP: 138/75   BP Source: Arm, Left Upper   Pulse: 67   Temp: 36.6 ?C (97.9 ?F)   SpO2: 96%   TempSrc: Oral   PainSc: Zero   Weight: 103.9 kg (229 lb)   Height: 177.8 cm (5' 10)     Body mass index is 32.86 kg/m?Marland Kitchen    Physical Exam    Vitals reviewed.   Constitutional: Patient appears energized, well-developed, well-nourished, in no apparent distress.  Responds appropriately to questions.  Neuro:  No tremor noted.  Patient is AOx4.  No asterixis.  HEENT:  Head is normocephalic and atraumatic.  No scleral icterus noted.  Conjunctiva pink and moist.  EOM normal & PERRLA.   Neck and Lymph: Normal ROM. Neck supple. No thyromegaly.  No lymphadenopathy.  No JVD  Cardiac:  S1 and S2  Respiratory:  lung sounds clear to auscultation bilaterally.  No wheezes or crackles.  GI:  Abdomen soft, round, non-distended, non-tender. BS present. No clinically evident ascites. No hernia.  +splenomegaly     Skin:  Skin is warm, dry, and intact. Nails show no clubbing. No jaundice.  Few spider nevi on upper torso.  +palmar erythema   Peripheral Vascular: No lower extremity edema.  Musculoskeletal:  ROM intact.    Psychiatric: Normal mood and affect. Behavior is normal. Judgment and thought content normal         Labs:  Labs reviewed in O2    MELD-Na score: 8 at 01/18/2021  9:39 AM  MELD score: 8 at 01/18/2021  9:39 AM  Calculated from:  Serum Creatinine: 1.04 MG/DL at 9/81/1914  7:82 AM  Serum Sodium: 138 MMOL/L (Using max of 137 MMOL/L) at 01/18/2021  9:39 AM  Total Bilirubin: 1.1 MG/DL at 9/56/2130  8:65 AM  INR(ratio): 1.1 at 01/18/2021  9:39 AM  Age: 37 years    Diagnostic tests:    EGD (May 2021):  Report in O2 and reviewed    Colonoscopy (January 2020)  Report scanned in O2 and reviewed    Imaging:    Abdominal US (February 14, 2021):  Impression  1.  US-1, negative. Visualization score B.    2.  Cirrhosis with diffuse hepatic heterogeneity. No splenomegaly or ascites.    3.  Cholelithiasis.    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  He is known to have evidence of decompensation including ascites/edema and EV  - We find his most recent MELD stable at 8 as of May 2022.  Discussed that given his low MELD and continued clinical stability, a liver transplant evaluation is not yet indicated.  We will continue to monitor for changes that may indicate otherwise.  - For now, we will continue to manage complications related to his liver disease accordingly.    - As for NAFLD RF, we again reviewed importance of diet and weight loss.  Consider the utility of liberalizing coffee and black tea consumption as some data that this may slow progression and reverse effects of NASH-related fibrosis.  HTN/HLP management encouraged as well.  - Again reviewed all alcohol use must be avoided.  This includes all NA beer and wine as well.  Random screens will continue  - MELD labs to be obtained.  Given his history we will continue with random screening    HCC Screening:  - Secondary to cirrhosis, he is at risk for developing HCC.  As a result, we will proceed with HCC screening.  - Recommend screening for [facility] with either abdominal ultrasound or alternating abdominal ultrasound with a triple phase CT of the abdomen with IV contrast OR MRI of the abdomen with IV contrast every 6 months.  - Last abdominal imaging was done June 2022).  Due to visualization, we will plan for a CT scan later this month.  He is needing to adjust the date of his follow up CT and will arrange for one in January 2023, we will await the results.  - AFP needed every 6 months to be done at time of imaging screen.  AFP with labs today   - Reminded TylerSaini that should he develop any new onset, unexpected weight loss, he is to notify the office for review.    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  He was referred to [facility] with a negative workup.  For now, will continue to monitor for changes with his subsequent imaging.  If needed will refer back to [facility]    HCV  - TylerAlberding is known to have had HCV, treated at an outside hospital ([place]) with SVR obtained.   - Reminded TylerVillamor that although he has cleared the virus, he is still at risk for reinfection should he partake in any high risk activity.  He verbalizes understanding    Hepatic encephalopathy:   - Today, he continues to report no signs of HE.  No interventions needed for now.  - Reminded him to be mindful for any signs of confusion/fogginess in thinking, worsening malaise, or tremors that may suggest the development of HE.  Should this occur, he is to notify the office for review.    Esophageal Varices:  - Last EGD was done May 2021. Repeat EGD recommended now for screening as he was found to have small EV with his last EGD.  Orders have been placed and he will arrange for this once he is able to establish transportation.  - If there is any evidence of medium/large esophageal varices, we would recommend esophageal varix band ligation.  If band ligation is performed, please continue to repeat (~ every 4-6 weeks) until eradication of varices.  - Reminded TylerLilley that should he have any episodes of hematemesis or melena, he should proceed to the ER immediately.    Ascites/Edema:   - Currently controlled with use of diuretics; lasix 40mg  and amiloride 5mg  daily.  No adjustments needed.  - Encouraged him to continue with a low sodium diet <2 grams/day, with high amounts of lean protein (see below for details).  - Should he have any new onset ascites, edema, or weight gain, he is to notify the office for review.    - Reminded TylerRauda that should he have any new onset fevers or chills, sudden sharp abdominal pain or discomfort, he should proceed to the ER to be evaluated for possible SBP.      History of alcohol abuse:    - Emphasized the need to stay abstinent from any alcohol.  Discussed this includes all NA beer and wine  - We will continue with random screening to ensure compliance    Smoking:  - Discussed importance of continued smoking cessation.  Reviewed potential risks and complications related to cigarette  use.  He verbalizes understanding of risks.    - Reviewed that if a liver transplant is needed in the future, he must be free of all nicottine    Protein Malnutrition:  - For patients with cirrhosis, it is very important to eat the right types and amounts of foods.  We recommend a diet low in carbohydrates/sugars and high in fresh fruits/vegetables, with the lean proteins; such as eggs, fish, chicken, nuts, and legumes.  We typically recommend 1.2-1.5gm/kg/day of protein, or 75-100 grams of protein every day.    - Discussed the need to minimize the intake of carbohydrates and sugars as well to avoid obesity.    - TylerGorden should eat at least three meals a day and three to four snacks between meals  - Bedtime snacks are especially important (preferably something with some protein)    - Patients with malnutrition and/or loss of muscular mass can improve their nutrition and muscular mass by drinking two cans of dietary supplements daily, particularly at bedtime.  These would include: Ensure, Boost, Carnation Instant Milk, Glucerna, (or similar supplements), if needed  - Please avoid eating raw seafood, especially shellfish, because of risk of serious illness    Bone Health/Vitamin D deficiency  - Secondary to cirrhosis, Mr. Misner is at risk for Vitamin D deficiency and developing osteopenia/osteoporosis.  We will continue to monitor for changes.  - Vitamin D with labs today, interventions to follow.  - He does follow with endocrinology as well (Dr. Achille Rich).  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  He is now up-to-date  - Last colonoscopy done January 2020.  Due to polyps (hepatic flexure TA, sigmoid TVA, and R-S TVA with HGD), recommend to repeat in 3 years (2023) or sooner if clinically indicated.  May plan colonoscopy with EGD.  - Routine screening for fat soluble vitamins encouraged yearly (Vitamin A, E) with supplementation and appropriate follow up if indicated.  Will obtain with follow up labs  - All patients with cirrhosis should avoid the use of Non-steroidal Anti-Inflammatory (NSAID) medications as they can cause significant injury to the kidneys in this population.  If needed, you may use Tylenol PRN, no more than 2g/day.  - Reminded TylerTurner that should he have any hospitalizations or ER visits, he is to notify the office as well.  - Discussed that if any elective surgeries are needed, we would recommend first reviewing with the clinic first, given his liver disease.    - COVID precautions reviewed and encouraged.      Follow-up:  He is to return to Liver Clinic in 6 months.  As always, he may come sooner if a problem arises.               I am grateful for the opportunity to participate in the care of this patient.  If you have any further questions, please don't hesitate to contact our office.      _____________________________  Christen Butter, APRN-C  Hepatology & Liver Transplantation  [facility]  [phone]    Total of 40 minutes were spent on the same day of the visit including preparing to see the patient, obtaining and reviewing separately obtained history, performing a medically appropriate examination and evaluation, counseling and educating the patient of the side effect profile of cirrhosis/pathophysiology  of the underlying disease process and subsequent development of portal HTN and its sequale, role of a liver transplant and current barriers (low MELD), ordering medications, tests, or procedures for ongoing management, referring and communication with other health care professionals, documenting clinical information in the electronic or other health record, interpreting results of labs and procedures ordered and communicating results to the patient, and care coordination.

## 2021-09-07 ENCOUNTER — Encounter: Admit: 2021-09-07 | Discharge: 2021-09-07 | Payer: MEDICARE | Primary: Family

## 2021-09-16 ENCOUNTER — Encounter: Admit: 2021-09-16 | Discharge: 2021-09-16 | Payer: MEDICARE | Primary: Family

## 2021-09-16 ENCOUNTER — Ambulatory Visit: Admit: 2021-09-16 | Discharge: 2021-09-16 | Payer: MEDICARE | Primary: Family

## 2021-09-16 DIAGNOSIS — K7460 Unspecified cirrhosis of liver: Secondary | ICD-10-CM

## 2021-09-16 MED ORDER — IOHEXOL 350 MG IODINE/ML IV SOLN
100 mL | Freq: Once | INTRAVENOUS | 0 refills | Status: CP
Start: 2021-09-16 — End: ?
  Administered 2021-09-16: 16:00:00 100 mL via INTRAVENOUS

## 2021-09-16 MED ORDER — SODIUM CHLORIDE 0.9 % IJ SOLN
50 mL | Freq: Once | INTRAVENOUS | 0 refills | Status: CP
Start: 2021-09-16 — End: ?
  Administered 2021-09-16: 16:00:00 50 mL via INTRAVENOUS

## 2021-09-22 ENCOUNTER — Encounter: Admit: 2021-09-22 | Discharge: 2021-09-22 | Payer: MEDICARE | Primary: Family

## 2021-09-22 DIAGNOSIS — K7460 Unspecified cirrhosis of liver: Secondary | ICD-10-CM

## 2021-09-26 ENCOUNTER — Encounter: Admit: 2021-09-26 | Discharge: 2021-09-26 | Payer: MEDICARE | Primary: Family

## 2021-09-26 DIAGNOSIS — Z87898 Personal history of other specified conditions: Secondary | ICD-10-CM

## 2021-09-26 MED ORDER — AMILORIDE 5 MG PO TAB
ORAL_TABLET | Freq: Every day | 1 refills
Start: 2021-09-26 — End: ?

## 2021-10-31 ENCOUNTER — Encounter: Admit: 2021-10-31 | Discharge: 2021-10-31 | Payer: MEDICARE | Primary: Family

## 2021-10-31 ENCOUNTER — Ambulatory Visit: Admit: 2021-10-31 | Discharge: 2021-11-01 | Payer: MEDICAID | Primary: Family

## 2021-10-31 DIAGNOSIS — I10 Essential (primary) hypertension: Secondary | ICD-10-CM

## 2021-10-31 DIAGNOSIS — E559 Vitamin D deficiency, unspecified: Secondary | ICD-10-CM

## 2021-10-31 MED ORDER — ZOLEDRONIC ACID-MANNITOL-WATER 5 MG/100 ML IV PGBK
5 mg | Freq: Once | INTRAVENOUS | 0 refills
Start: 2021-10-31 — End: ?

## 2021-10-31 MED ORDER — CALCIUM CITRATE-VITAMIN D2 315 MG-5 MCG (200 UNIT) PO TAB
1 | ORAL_TABLET | Freq: Two times a day (BID) | ORAL | 1 refills | 45.00000 days | Status: AC
Start: 2021-10-31 — End: ?

## 2021-10-31 MED ORDER — CHOLECALCIFEROL (VITAMIN D3) 50 MCG (2,000 UNIT) PO TAB
2000 [IU] | ORAL_TABLET | Freq: Every day | ORAL | 1 refills | 84.00000 days | Status: AC
Start: 2021-10-31 — End: ?

## 2021-10-31 NOTE — Progress Notes
Chief Complaint   Patient presents with   ? Osteoporosis        Date of Service: 10/31/2021    Referring physician:  Rayfield Citizen, MD  [address]  [place],  [place] [address]    Dede Query Tyler Wolfe is a 66 y.o. male. DOB: [birth year]   MRN#: [id number]        HPI:  Osteoporosis  ? Dx: June 2022 when he had a bone density scan.  He has liver cirrhosis density scan was ordered by gastroenterologist.  He has never received any treatments in the past.  Lowest bone density at right femur.  Reviewed bone density images.  Lumbar spine bone density is slightly low, better than femur bone density.  ? Hx of fracture/fall: 22 years back  ? Hip/pelvic/thigh pain:none     Secondary contributors:  ? Kidney stones:none  ? Family hx of osteoporosis: Grandmother had hip fracture  ? Steroid use: None  ? He has history of alcohol abuse.  He stopped alcohol 6 years back.  He is a non-smoker.     Lifestyle intervention:  ? Exercise:   Not much  ? Dietary calcium intake:   Once and ring of dairy daily  ? Calcium supplement:   No calcium tablets  ? He has vitamin D deficiency on Vitamin D supplement:  He takes vitamin D 50,000 once a week.        Review of Systems   Constitutional: Negative for fever.   HENT: Negative for congestion.    Eyes: Negative for blurred vision.   Respiratory: Negative for cough.    Cardiovascular: Negative for chest pain.   Gastrointestinal: Negative for nausea.   Genitourinary: Negative for frequency.   Musculoskeletal: Negative for myalgias.   Skin: Negative for rash.   Neurological: Negative for headaches.   Endo/Heme/Allergies: Does not bruise/bleed easily.   Psychiatric/Behavioral: Negative for depression.       Medical History:   Diagnosis Date   ? Hypertension      Surgical History:   Procedure Laterality Date   ? ESOPHAGOGASTRODUODENOSCOPY WITH BIOPSY - FLEXIBLE N/A 05/27/2019    Performed by Cardell Peach, MD at [facility]   ? ESOPHAGOGASTRODUODENOSCOPY WITH SPECIMEN COLLECTION BY BRUSHING/ WASHING N/A 01/20/2020    Performed by Cherlynn June, MD at [facility] END[facility]   ? HAND TENDON SURGERY Left    ? HX KNEE SURGERY Right      History reviewed. No pertinent family history.  Social History     Socioeconomic History   ? Marital status: Single   Tobacco Use   ? Smoking status: Former     Packs/day: 0.25     Types: Cigarettes     Quit date: 06/2021     Years since quitting: 0.3   ? Smokeless tobacco: Never   Vaping Use   ? Vaping Use: Never used   Substance and Sexual Activity   ? Alcohol use: Not Currently     Alcohol/week: 0.0 standard drinks   ? Drug use: Not Currently   ? Sexual activity: Not Currently     Partners: Female     Birth control/protection: None       aMILoride  Calcium Citrate-Vitamin D2 Tab  CHOLEcalciferoL (vitamin D3)  EPINEPHrine  ERGOcalciferoL (vitamin D2)  furosemide      Objective:     ? aMILoride (MIDAMOR) 5 mg tablet TAKE 1 TABLET BY MOUTH EVERY DAY   ? Calcium Citrate-Vitamin D2 315 mg-5 mcg (  200 unit) tab Take 1 tablet by mouth twice daily.   ? CHOLEcalciferoL (vitamin D3) (VITAMIN D3) 50 mcg (2,000 unit) tablet Take one tablet by mouth daily.   ? epinephrine(+) (EPIPEN JR) 0.15 mg/0.3 mL (1:2,000) syringe Inject 0.15 mg to area(s) as directed as Needed.   ? ERGOcalciferoL (vitamin D2) (VITAMIN D2) 1,250 mcg (50,000 unit) capsule Take one capsule by mouth every 7 days.   ? furosemide (LASIX) 40 mg tablet Take 40 mg by mouth every morning.     Vitals:    10/31/21 0927   BP: 125/76   BP Source: Arm, Left Upper   Pulse: 76   PainSc: Two   Weight: 105.1 kg (231 lb 9.6 oz)   Height: 180 cm (5' 10.87)     Body mass index is 32.42 kg/m?Marland Kitchen     Physical Exam  Vitals and nursing note reviewed.   Constitutional:       General: He is not in acute distress.     Appearance: Normal appearance. He is not ill-appearing, toxic-appearing or diaphoretic.   HENT:      Head: Normocephalic and atraumatic.      Nose: Nose normal.   Eyes:      Conjunctiva/sclera: Conjunctivae normal.   Pulmonary: Effort: Pulmonary effort is normal. No respiratory distress.   Abdominal:      General: Abdomen is flat.   Musculoskeletal:         General: Normal range of motion.      Cervical back: Normal range of motion.   Skin:     Coloration: Skin is not jaundiced.   Neurological:      Mental Status: He is alert and oriented to person, place, and time.   Psychiatric:         Mood and Affect: Mood normal.               Comprehensive Metabolic Profile    Lab Results   Component Value Date/Time    NA 140 08/16/2021 10:41 AM    K 4.9 08/16/2021 10:41 AM    CL 104 08/16/2021 10:41 AM    CO2 27 08/16/2021 10:41 AM    GAP 9 08/16/2021 10:41 AM    BUN 14 08/16/2021 10:41 AM    CR 0.98 08/16/2021 10:41 AM    GLU 105 (H) 08/16/2021 10:41 AM    GLU 99 09/12/2017 09:48 AM    Lab Results   Component Value Date/Time    CA 9.7 08/16/2021 10:41 AM    ALBUMIN 4.5 08/16/2021 10:41 AM    TOTPROT 8.0 08/16/2021 10:41 AM    ALKPHOS 52 08/16/2021 10:41 AM    AST 20 08/16/2021 10:41 AM    ALT 12 08/16/2021 10:41 AM    TOTBILI 1.0 08/16/2021 10:41 AM    GFR >60 07/20/2020 10:41 AM    GFRAA >60 07/20/2020 10:41 AM        02/2021 BMD    LUMBAR SPINE, L1-L4   0.986 g/cm2, T-score of -2.0   There are however hypertrophic degenerative changes of the lumbar spine   which could artificially elevate the bone mineral density. ? ?     LEFT FEMORAL NECK ?   0.731 g/cm2, T-score of -2.6 ? ?     LEFT TOTAL HIP ?   0.848 g/cm2, T-score of -1.8     RIGHT FEMORAL NECK   0.695 g/cm2, T-score of -2.9 ? ?     RIGHT TOTAL HIP   0.744 g/cm2, T-score of -  2.5       Assessment and Plan:    Estelle Skibicki. Walsh was seen today for osteoporosis.    Diagnoses and all orders for this visit:    Age-related osteoporosis without current pathological fracture  -     COMPREHENSIVE METABOLIC PANEL; Future; Expected date: 10/31/2021  -     PARATHYROID HORMONE; Future; Expected date: 10/31/2021    Vitamin D deficiency  -     25-OH VITAMIN D (D2 + D3); Future; Expected date: 10/31/2021    Other orders  -     Calcium Citrate-Vitamin D2 315 mg-5 mcg (200 unit) tab; Take 1 tablet by mouth twice daily.  -     CHOLEcalciferoL (vitamin D3) (VITAMIN D3) 50 mcg (2,000 unit) tablet; Take one tablet by mouth daily.  -     zoledronic acid/mannitol/water (RECLAST) 5mg /100 mL IVPB      Osteoporosis:  Contributing factors:  History of alcohol abuse  Liver cirrhosis due to hepatitis C.    Good calcium and vitamin D replacement discussed.  Continue 1 serving of dairy daily.  Start calcium citrate twice a day.  300 mg each.  Healthy diet.    We will proceed with Reclast.  Reclast infusion belongs to a class of medicine called bisphosphonates.  We recommend patients to take 2 tablets of Tylenol before infusion and 2 tablets of Tylenol after infusion as sometimes flulike illness can happen.  Bisphosphonates can rarely cause osteonecrosis of jaw when patients get dental procedures like extraction or dental implants.  Please let your dentist know that you are on these medicines if you are planning to get any dental procedure.  Very rarely it can cause atypical fracture of femur.  Please let us know if you experience thigh pain at any time.    Start vitamin D 2000 units daily after you complete vitamin D 50,000 once a week for vitamin D deficiency.          Electronically signed by Lucrezia Starch, MD 10/31/2021

## 2021-10-31 NOTE — Patient Instructions
Reclast infusion belongs to a class of medicine called bisphosphonates.  We recommend patients to take 2 tablets of Tylenol before infusion and 2 tablets of Tylenol after infusion as sometimes flulike illness can happen.  Bisphosphonates can rarely cause osteonecrosis of jaw when patients get dental procedures like extraction or dental implants.  Please let your dentist know that you are on these medicines if you are planning to get any dental procedure.  Very rarely it can cause atypical fracture of femur.  Please let us know if you experience thigh pain at any time.

## 2021-11-01 DIAGNOSIS — K7460 Unspecified cirrhosis of liver: Secondary | ICD-10-CM

## 2021-11-01 DIAGNOSIS — M810 Age-related osteoporosis without current pathological fracture: Secondary | ICD-10-CM

## 2021-11-01 DIAGNOSIS — B182 Chronic viral hepatitis C: Secondary | ICD-10-CM

## 2021-11-21 ENCOUNTER — Encounter: Admit: 2021-11-21 | Discharge: 2021-11-21 | Payer: MEDICARE | Primary: Family

## 2021-12-16 ENCOUNTER — Encounter: Admit: 2021-12-16 | Discharge: 2021-12-16 | Payer: MEDICARE | Primary: Family

## 2021-12-21 ENCOUNTER — Encounter: Admit: 2021-12-21 | Discharge: 2021-12-21 | Payer: MEDICARE | Primary: Family

## 2021-12-21 ENCOUNTER — Ambulatory Visit: Admit: 2021-12-21 | Discharge: 2021-12-22 | Payer: MEDICARE | Primary: Family

## 2021-12-21 DIAGNOSIS — I10 Essential (primary) hypertension: Secondary | ICD-10-CM

## 2021-12-21 MED ORDER — ZOLEDRONIC ACID-MANNITOL-WATER 5 MG/100 ML IV PGBK
5 mg | Freq: Once | INTRAVENOUS | 0 refills | Status: CP
Start: 2021-12-21 — End: ?

## 2021-12-21 MED ORDER — ZOLEDRONIC ACID-MANNITOL-WATER 5 MG/100 ML IV PGBK
5 mg | Freq: Once | INTRAVENOUS | 0 refills | Status: CN
Start: 2021-12-21 — End: ?

## 2021-12-21 NOTE — Progress Notes
1100:  Patient tolerated infusion without difficulty.  No signs of reaction noted.  Medication information provided.

## 2021-12-22 DIAGNOSIS — M810 Age-related osteoporosis without current pathological fracture: Secondary | ICD-10-CM

## 2022-02-10 ENCOUNTER — Encounter: Admit: 2022-02-10 | Discharge: 2022-02-10 | Payer: MEDICARE | Primary: Family

## 2022-02-15 ENCOUNTER — Encounter: Admit: 2022-02-15 | Discharge: 2022-02-15 | Payer: MEDICARE | Primary: Family

## 2022-02-15 ENCOUNTER — Ambulatory Visit: Admit: 2022-02-15 | Discharge: 2022-02-15 | Payer: MEDICAID | Primary: Family

## 2022-02-15 ENCOUNTER — Ambulatory Visit: Admit: 2022-02-15 | Discharge: 2022-02-15 | Payer: MEDICARE | Primary: Family

## 2022-02-15 DIAGNOSIS — E8779 Other fluid overload: Secondary | ICD-10-CM

## 2022-02-15 DIAGNOSIS — I10 Essential (primary) hypertension: Secondary | ICD-10-CM

## 2022-02-15 DIAGNOSIS — F17210 Nicotine dependence, cigarettes, uncomplicated: Secondary | ICD-10-CM

## 2022-02-15 DIAGNOSIS — Z0000 Encounter for general adult medical examination without abnormal findings: Secondary | ICD-10-CM

## 2022-02-15 DIAGNOSIS — K7460 Unspecified cirrhosis of liver: Secondary | ICD-10-CM

## 2022-02-15 DIAGNOSIS — I8510 Secondary esophageal varices without bleeding: Secondary | ICD-10-CM

## 2022-02-15 DIAGNOSIS — E46 Unspecified protein-calorie malnutrition: Secondary | ICD-10-CM

## 2022-02-15 DIAGNOSIS — E559 Vitamin D deficiency, unspecified: Secondary | ICD-10-CM

## 2022-02-15 DIAGNOSIS — B182 Chronic viral hepatitis C: Secondary | ICD-10-CM

## 2022-02-15 DIAGNOSIS — M810 Age-related osteoporosis without current pathological fracture: Secondary | ICD-10-CM

## 2022-02-15 LAB — PARATHYROID HORMONE: PTH HORMONE: 72 pg/mL — ABNORMAL HIGH (ref 10–65)

## 2022-02-15 LAB — COMPREHENSIVE METABOLIC PANEL
ALK PHOSPHATASE: 50 U/L (ref 25–110)
AST: 23 U/L (ref 7–40)
CALCIUM: 10 mg/dL — ABNORMAL LOW (ref 8.5–10.6)
CREATININE: 0.8 mg/dL (ref 0.4–1.24)
GLUCOSE,PANEL: 96 mg/dL (ref 70–100)
POTASSIUM: 5.2 MMOL/L — ABNORMAL HIGH (ref 3.5–5.1)
SODIUM: 140 MMOL/L (ref 137–147)
TOTAL PROTEIN: 8.2 g/dL — ABNORMAL HIGH (ref 6.0–8.0)

## 2022-02-15 LAB — CBC AND DIFF
BASOPHILS %: 1 % (ref 0–2)
EOSINOPHILS %: 2 % (ref 0–5)
HEMATOCRIT: 45 % (ref >60)
HEMOGLOBIN: 15 g/dL (ref 13.5–16.5)
LYMPHOCYTES %: 14 % — ABNORMAL LOW (ref 24–44)
MCH: 32 pg (ref 26–34)
MCHC: 35 g/dL (ref 32.0–36.0)
MCV: 93 FL (ref 80–100)
MONOCYTES %: 9 % (ref 4–12)
MPV: 9.7 FL (ref 7–11)
NEUTROPHILS %: 74 % (ref 41–77)
PLATELET COUNT: 121 K/UL — ABNORMAL LOW (ref 150–400)
RBC COUNT: 4.8 M/UL (ref 4.4–5.5)
RDW: 13 % (ref 11–15)
WBC COUNT: 7.7 K/UL (ref 4.5–11.0)

## 2022-02-15 LAB — ALPHA FETO PROTEIN (AFP): AFP: 6.2 ng/mL (ref 0.0–15.0)

## 2022-02-15 LAB — PROTIME INR (PT)
INR: 1.1 g/dL (ref >60)
PROTIME: 12 s (ref 9.5–14.2)

## 2022-02-15 LAB — 25-OH VITAMIN D (D2 + D3): VITAMIN D (25-OH) TOTAL: 39 ng/mL (ref 30–80)

## 2022-02-15 NOTE — Progress Notes
Date of Service: 02/15/2022     Subjective:             Tyler Wolfe is a 66 y.o. male.    History of Present Illness  [name] is a 66 year old male who presents today for a 6 month follow up.  As you know, 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  He is known to have evidence of decompensated disease with a history of ascites/edema and EV, well managed at this time.  Comorbidities for [name] are limited, but include HTN and osteoporosis.    Interval History:  Since last being seen, [name] reports doing fair.  He states he has no specific concerns with his liver disease.  He is asking clarification regarding his MELD score as he recalls he was told of two different values and wanted to see what the correct level was.  He additionally is seeking clarification on his most recent abdominal imaging (January 2023 CT scan).  He reports no other changes or concerns.  He reports no hospitalizations or ER visits.    In regards to [name]'s liver disease, we are aware he has had evidence of decompensation with a history of ascites/edema and EV.  He reports his fluid status is managed with current diuretics (Amiloride 5mg  and Furosemide 40mg  daily).  He denies any episodes of hematemesis, melena, or hematochezia at this time.  He has not yet been able to complete his EGD due to transportation issues and is aware this has been recommended.  He continues to deny any jaundice, pruritus, or episodes of GIB.  In reviewing symptoms concerning for HE, he reports no such symptom.  Today, [name] reports no abdominal pain, nausea, vomiting, hematemesis, diarrhea, melena, hematochezia, fevers, chills, fatigue, or unexpected weight loss.      As for his comorbiditities, we find he continues to follow closely with his PCP for management.  Today, he reports no chest pain, palpitations or SOB.    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  He reports he has remained free of cigarette use.  He reports his last cigarette was over 6 months ago.  He has had no relapse to alcohol with his last drink over 5 years ago.  He presents alone today.    Health Maintenance:  Colonoscopy-  2020  EGD-  2021  Influenza-  2022  COVID (Moderna x 3, Bivalent booster)- 2022  BMD- June 2022         Review of Systems  A complete 10 point review of systems was negative except those listed in the HPI.    Objective:         Your Current Medications:       Instructions    aMILoride (MIDAMOR) 5 mg tablet TAKE 1 TABLET BY MOUTH EVERY DAY    Calcium Citrate-Vitamin D2 315 mg-5 mcg (200 unit) tab Take 1 tablet by mouth twice daily.    CHOLEcalciferoL (vitamin D3) (VITAMIN D3) 50 mcg (2,000 unit) tablet Take one tablet by mouth daily.    epinephrine(+) (EPIPEN JR) 0.15 mg/0.3 mL (1:2,000) syringe Inject 0.3 mL into the muscle as Needed.    furosemide (LASIX) 40  mg tablet Take one tablet by mouth every morning.    zoledronic acid/mannitol-water (RECLAST IV) Administer  through vein. Once per year infusion @[facility]         Vitals:    02/15/22 0744   BP: 101/78   BP Source: Arm, Right Upper   Pulse: 56   Temp: 36.4 ?C (97.6 ?F)   SpO2: 99%   TempSrc: Oral   PainSc: One   Weight: 94.5 kg (208 lb 6.4 oz)   Height: 177.8 cm (5' 10)     Body mass index is 29.9 kg/m?Marland Kitchen    Physical Exam    Vitals reviewed.   Constitutional: Patient appears energized, well-developed, well-nourished, in no apparent distress.  Responds appropriately to questions.  Neuro:  No tremor noted.  Patient is AOx4.  No asterixis.  HEENT:  Head is normocephalic and atraumatic.  No scleral icterus noted.  Conjunctiva pink and moist.  EOM normal & PERRLA.   Neck and Lymph: Normal ROM. Neck supple.   Cardiac:  S1 and S2  Respiratory:  lung sounds clear to auscultation bilaterally.  No wheezes or crackles.  GI:  Abdomen soft, round, non-distended, non-tender. BS present. No clinically evident ascites. No hernia.  +splenomegaly     Skin:  Skin is warm, dry, and intact. Nails show no clubbing. No jaundice.  Few spider nevi on upper torso.  +palmar erythema   Peripheral Vascular: No lower extremity edema.  Musculoskeletal:  ROM intact.    Psychiatric: Normal mood and affect. Behavior is normal. Judgment and thought content normal         Labs:  Labs reviewed in O2    MELD-Na: 7 at 08/16/2021 10:41 AM  MELD: 7 at 08/16/2021 10:41 AM  Calculated from:  Serum Creatinine: 0.98 MG/DL (Using min of 1 MG/DL) at 14/03/8294 62:13 AM  Serum Sodium: 140 MMOL/L (Using max of 137 MMOL/L) at 08/16/2021 10:41 AM  Total Bilirubin: 1.0 MG/DL at 04/17/5783 69:62 AM  INR(ratio): 1.1 at 08/16/2021 10:41 AM    Diagnostic tests:    EGD (May 2021):  - Small (< 5 mm) esophageal varices. Not banded.   - Hematin (altered blood/coffee-ground-like material) in the gastric antrum and in the  gastric body.   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   - No specimens collected.     Colonoscopy (January 2020)  Report scanned in O2 and reviewed    Imaging:    CT Abdomen w/wo contrast (09/16/2021):  IMPRESSION     1. Cirrhosis. No focal hepatic observations.   2. Small esophageal and portosystemic varices. Normal spleen size. No ascites.   3. Similar mild haziness of the jejunal mesentery which may be sequelae of previous inflammation or infarct. No discrete mass.   4. Cholelithiasis.     * Full report reviewed in O2.  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  He is known to have evidence of decompensation including ascites/edema and EV  - We find his most recent MELD stable at 7 as of December 2022.  Discussed that given his low MELD and continued clinical stability, a liver transplant evaluation is not yet indicated.  We will continue to monitor for changes that may indicate otherwise.  - For now, we will continue to manage complications related to his  liver disease accordingly.    - As for his known RF for NAFLD, we again reviewed importance of diet and weight loss.  Consider the utility of liberalizing coffee and black tea consumption as some data that this may slow progression and reverse effects of NASH-related fibrosis.  HTN/HLP management encouraged as well.  - Again reviewed all alcohol use must be avoided.  This includes all NA beer and wine as well.  Random screens will continue  - OTC herbal supplements should be avoided.  - MELD labs to be obtained.     HCC Screening:  - Secondary to cirrhosis, he is at risk for developing HCC.  As a result, we will proceed with HCC screening.  - Recommend screening for [facility] with either abdominal ultrasound or alternating abdominal ultrasound with a triple phase CT of the abdomen with IV contrast OR MRI of the abdomen with IV contrast every 6 months.  - Last abdominal imaging was done January 2023 with a repeat US scheduled for today (02/15/2022), we will await results  - AFP needed every 6 months to be done at time of imaging screen.  AFP with labs today   - Reminded [name] that should he develop any new onset, unexpected weight loss, he is to notify the office for review.    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  He was referred to [facility] with a negative workup.  For now, will continue to monitor for changes with his subsequent imaging (no mass reported on last CT abdomen in January 2023).  If needed will refer back to [facility]    HCV  - [name] is known to have had HCV, treated at an outside hospital ([place]) with SVR obtained.   - Reminded [name] that although he has cleared the virus, he is still at risk for reinfection should he partake in any high risk activity.  He verbalizes understanding    Hepatic encephalopathy:   - Today, he continues to report no signs of HE.  No interventions needed for now.  - Reminded him to be mindful for any signs of confusion/fogginess in thinking, worsening malaise, or tremors that may suggest the development of HE.  Should this occur, he is to notify the office for review.    Esophageal Varices:  - Last EGD was done May 2021. Repeat EGD recommended now for screening as he was found to have small EV with his last EGD.  Orders have been placed and can be renewed if needed.  He is working to obtain transportation to get the procedure completed.  - If there is any evidence of medium/large esophageal varices, we would recommend esophageal varix band ligation.  If band ligation is performed, please continue to repeat (~ every 4-6 weeks) until eradication of varices.  - Reminded [name] that should he have any episodes of hematemesis or melena, he should proceed to the ER immediately.    Ascites/Edema:   - Currently controlled with use of diuretics; lasix 40mg  and amiloride 5mg  daily.  No adjustments needed.  - Encouraged him to continue with a low sodium diet <2 grams/day, with high amounts of lean protein (see below for details).  - Should he have any new onset ascites, edema, or weight gain, he is to notify the office for review.    - Reminded [name] that should he have any new onset fevers or chills, sudden sharp abdominal pain or discomfort, he should  proceed to the ER to be evaluated for possible SBP.      History of alcohol abuse:    - Emphasized the need to stay abstinent from any alcohol.  Discussed this includes all NA beer and wine  - We will continue with random screening to ensure compliance    Smoking:  - Discussed importance of continued smoking cessation.  Congratulated him on his cessation thus far and encourage him to continue.  - Reviewed potential risks and complications related to cigarette use.  He verbalizes understanding of risks.    - Reviewed that if a liver transplant is needed in the future, he must be free of all nicottine    Protein Malnutrition:  - For patients with cirrhosis, it is very important to eat the right types and amounts of foods.  We recommend a diet low in carbohydrates/sugars and high in fresh fruits/vegetables, with the lean proteins; such as eggs, fish, chicken, nuts, and legumes.  We typically recommend 1.2-1.5gm/kg/day of protein, or 75-100 grams of protein every day.    - Discussed the need to minimize the intake of carbohydrates and sugars as well to avoid obesity.    - [name] should eat at least three meals a day and three to four snacks between meals  - Bedtime snacks are especially important (preferably something with some protein)    - Patients with malnutrition and/or loss of muscular mass can improve their nutrition and muscular mass by drinking two cans of dietary supplements daily, particularly at bedtime.  These would include: Ensure, Boost, Carnation Instant Milk, Glucerna, (or similar supplements), if needed to meet protein/caloric recommendations.  - Please avoid eating raw seafood, especially shellfish, because of risk of serious illness    Bone Health/Vitamin D deficiency  - Secondary to cirrhosis, [name] is at risk for Vitamin D deficiency and developing osteopenia/osteoporosis.  We will continue to monitor for changes.  - Vitamin D with labs today, interventions to follow.  - He does follow with endocrinology as well (Dr. Ezequiel Kayser Wolfe) with reclast infusion x 1 started.  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  He is now up-to-date  - Last colonoscopy done January 2020.  Due to polyps (hepatic flexure TA, sigmoid TVA, and R-S TVA with HGD), recommend to repeat in 3 years (2023) or sooner if clinically indicated.  May plan colonoscopy with EGD when he is able to arrange for transportation.  - Routine screening for fat soluble vitamins encouraged yearly (Vitamin A, E) with supplementation and appropriate follow up if indicated.  Will obtain with follow up labs  - All patients with cirrhosis should avoid the use of Non-steroidal Anti-Inflammatory (NSAID) medications as they can cause significant injury to the kidneys in this population.  If needed, you may use Tylenol PRN, no more than 2g/day.  - Reminded [name] that should he have any hospitalizations or ER visits, he is to notify the office as well.  - Discussed that if any elective surgeries are needed, we would recommend first reviewing with the clinic first, given his liver disease.    - COVID precautions reviewed and encouraged.  He is up-to-date on current vaccine/booster recommendations.      Follow-up:  He is to return to [facility] in 6 months to meet with Tyler Wolfe and myself in 1 year.  As always, he  may come sooner if a problem arises.               I am grateful for the opportunity to participate in the care of this patient.  If you have any further questions, please don't hesitate to contact our office.      _____________________________  Christen Butter, APRN-C  Hepatology & Liver Transplantation  [facility]  [phone]    Total of 40 minutes were spent on the same day of the visit including preparing to see the patient, obtaining and reviewing separately obtained history, performing a medically appropriate examination and evaluation, counseling and educating the patient of the side effect profile of cirrhosis/pathophysiology of the underlying disease process and subsequent development of portal HTN and its sequale, role of a liver transplant and current barriers (low MELD), ordering medications, tests, or procedures for ongoing management, referring and communication with other health care professionals, documenting clinical information in the electronic or other health record, interpreting results of labs and procedures ordered and communicating results to the patient, and care coordination.

## 2022-02-17 ENCOUNTER — Encounter: Admit: 2022-02-17 | Discharge: 2022-02-17 | Payer: MEDICARE | Primary: Family

## 2022-02-17 DIAGNOSIS — K7460 Unspecified cirrhosis of liver: Secondary | ICD-10-CM

## 2022-03-23 ENCOUNTER — Encounter: Admit: 2022-03-23 | Discharge: 2022-03-23 | Payer: MEDICARE | Primary: Family

## 2022-03-23 DIAGNOSIS — Z87898 Personal history of other specified conditions: Secondary | ICD-10-CM

## 2022-03-23 MED ORDER — AMILORIDE 5 MG PO TAB
ORAL_TABLET | ORAL | 1 refills | 90.00000 days | Status: AC
Start: 2022-03-23 — End: ?

## 2022-04-06 ENCOUNTER — Encounter: Admit: 2022-04-06 | Discharge: 2022-04-06 | Payer: MEDICARE | Primary: Family

## 2022-04-13 ENCOUNTER — Ambulatory Visit: Admit: 2022-04-13 | Discharge: 2022-04-13 | Payer: MEDICARE | Primary: Family

## 2022-04-13 ENCOUNTER — Ambulatory Visit: Admit: 2022-04-13 | Discharge: 2022-04-14 | Payer: MEDICARE | Primary: Family

## 2022-04-13 ENCOUNTER — Encounter: Admit: 2022-04-13 | Discharge: 2022-04-13 | Payer: MEDICARE | Primary: Family

## 2022-04-13 DIAGNOSIS — E559 Vitamin D deficiency, unspecified: Secondary | ICD-10-CM

## 2022-04-13 DIAGNOSIS — M810 Age-related osteoporosis without current pathological fracture: Secondary | ICD-10-CM

## 2022-04-13 DIAGNOSIS — E349 Endocrine disorder, unspecified: Secondary | ICD-10-CM

## 2022-04-13 DIAGNOSIS — I10 Essential (primary) hypertension: Secondary | ICD-10-CM

## 2022-04-13 LAB — COMPREHENSIVE METABOLIC PANEL
ALBUMIN: 4.2 g/dL (ref 3.5–5.0)
ALK PHOSPHATASE: 44 U/L (ref 25–110)
ALT: 13 U/L (ref 7–56)
ANION GAP: 9 (ref 3–12)
AST: 22 U/L (ref 7–40)
BLD UREA NITROGEN: 13 mg/dL (ref 7–25)
CALCIUM: 9.1 mg/dL (ref 8.5–10.6)
CHLORIDE: 103 MMOL/L (ref 98–110)
CO2: 28 MMOL/L (ref 21–30)
CREATININE: 0.9 mg/dL (ref 0.4–1.24)
EGFR: >60 mL/min (ref >60)
GLUCOSE,PANEL: 119 mg/dL — ABNORMAL HIGH (ref 70–100)
POTASSIUM: 4.6 MMOL/L (ref 3.5–5.1)
SODIUM: 140 MMOL/L (ref 137–147)
TOTAL BILIRUBIN: 0.9 mg/dL (ref 0.3–1.2)
TOTAL PROTEIN: 7.3 g/dL (ref 6.0–8.0)

## 2022-04-13 LAB — PARATHYROID HORMONE: PTH HORMONE: 59 pg/mL (ref 10–65)

## 2022-04-13 NOTE — Progress Notes
Chief Complaint   Patient presents with   ? Osteoporosis        Date of Service: 04/13/2022    Tyler Wolfe is a 66 y.o. male. DOB: 1956-05-10   MRN#: [id number]        HPI:  Osteoporosis  ? Dx: June 2022 when he had a bone density scan.  He has liver cirrhosis density scan was ordered by gastroenterologist.  He has never received any treatments in the past.  Lowest bone density at right femur.    ? Received Reclast in April 2023.  ? Hx of fracture/fall: 22 years back  ? Hip/pelvic/thigh pain:none     Secondary contributors:  ? Kidney stones:none  ? Family hx of osteoporosis: Grandmother had hip fracture  ? Steroid use: None  ? He has history of alcohol abuse.  He stopped alcohol 6 years back.  He is a non-smoker.     Lifestyle intervention:  ? Exercise:   Not much  ? Dietary calcium intake:  he has some cheese.   ? Calcium supplement:   Calcium citrate: 600 mg daily.  ? He has vitamin D deficiency on Vitamin D supplement:  He takes vitamin D 2000 units daily.        Review of Systems   Constitutional: Negative for fever.   Cardiovascular: Negative for chest pain.       Medical History:   Diagnosis Date   ? Hypertension      Surgical History:   Procedure Laterality Date   ? ESOPHAGOGASTRODUODENOSCOPY WITH BIOPSY - FLEXIBLE N/A 05/27/2019    Performed by Cardell Peach, MD at [facility]   ? ESOPHAGOGASTRODUODENOSCOPY WITH SPECIMEN COLLECTION BY BRUSHING/ WASHING N/A 01/20/2020    Performed by Cherlynn June, MD at [facility]   ? HAND TENDON SURGERY Left    ? HX KNEE SURGERY Right      Family History   Problem Relation Age of Onset   ? None Reported Mother    ? None Reported Father      Social History     Socioeconomic History   ? Marital status: Single   Tobacco Use   ? Smoking status: Former     Packs/day: 0.25     Types: Cigarettes     Quit date: 06/2021     Years since quitting: 0.8   ? Smokeless tobacco: Never   Vaping Use   ? Vaping Use: Never used   Substance and Sexual Activity   ? Alcohol use: Not Currently     Alcohol/week: 0.0 standard drinks of alcohol   ? Drug use: Not Currently   ? Sexual activity: Not Currently     Partners: Female     Birth control/protection: None       aMILoride  Calcium Citrate-Vitamin D2 Tab  CHOLEcalciferoL (vitamin D3)  EPINEPHrine  furosemide  RECLAST IV      Objective:     ? aMILoride (MIDAMOR) 5 mg tablet TAKE 1 TABLET BY MOUTH EVERY DAY   ? Calcium Citrate-Vitamin D2 315 mg-5 mcg (200 unit) tab Take 1 tablet by mouth twice daily. (Patient taking differently: Take 1 tablet by mouth daily.)   ? CHOLEcalciferoL (vitamin D3) (VITAMIN D3) 50 mcg (2,000 unit) tablet Take one tablet by mouth daily.   ? epinephrine(+) (EPIPEN JR) 0.15 mg/0.3 mL (1:2,000) syringe Inject 0.3 mL into the muscle as Needed.   ? furosemide (LASIX) 40 mg tablet Take one tablet by mouth every morning.   ?  zoledronic acid/mannitol-water (RECLAST IV) Administer  through vein. Once per year infusion @[facility]      Vitals:    04/13/22 1421   BP: 110/67   BP Source: Arm, Right Upper   Pulse: 61   Temp: 36.7 ?C (98.1 ?F)   Resp: 16   SpO2: 97%   PainSc: Zero   Weight: 91.9 kg (202 lb 9.6 oz)   Height: 177.8 cm (5' 10)     Body mass index is 29.07 kg/m?Marland Kitchen     Physical Exam  Vitals and nursing note reviewed.   Constitutional:       General: He is not in acute distress.     Appearance: Normal appearance. He is not ill-appearing, toxic-appearing or diaphoretic.   HENT:      Head: Normocephalic and atraumatic.      Nose: Nose normal.   Eyes:      Conjunctiva/sclera: Conjunctivae normal.   Neurological:      Mental Status: He is alert and oriented to person, place, and time.   Psychiatric:         Mood and Affect: Mood normal.               Comprehensive Metabolic Profile    Lab Results   Component Value Date/Time    NA 140 02/15/2022 08:34 AM    K 5.2 (H) 02/15/2022 08:34 AM    CL 105 02/15/2022 08:34 AM    CO2 28 02/15/2022 08:34 AM    GAP 7 02/15/2022 08:34 AM    BUN 12 02/15/2022 08:34 AM    CR 0.82 02/15/2022 08:34 AM    GLU 96 02/15/2022 08:34 AM    GLU 99 09/12/2017 09:48 AM    Lab Results   Component Value Date/Time    CA 10.0 02/15/2022 08:34 AM    ALBUMIN 4.6 02/15/2022 08:34 AM    TOTPROT 8.2 (H) 02/15/2022 08:34 AM    ALKPHOS 50 02/15/2022 08:34 AM    AST 23 02/15/2022 08:34 AM    ALT 14 02/15/2022 08:34 AM    TOTBILI 0.8 02/15/2022 08:34 AM    GFR >60 07/20/2020 10:41 AM    GFRAA >60 07/20/2020 10:41 AM        02/2021 BMD    LUMBAR SPINE, L1-L4   0.986 g/cm2, T-score of -2.0   There are however hypertrophic degenerative changes of the lumbar spine   which could artificially elevate the bone mineral density. ? ?     LEFT FEMORAL NECK ?   0.731 g/cm2, T-score of -2.6 ? ?     LEFT TOTAL HIP ?   0.848 g/cm2, T-score of -1.8     RIGHT FEMORAL NECK   0.695 g/cm2, T-score of -2.9 ? ?     RIGHT TOTAL HIP   0.744 g/cm2, T-score of -2.5       Assessment and Plan:    Tyler Wolfe. Tyler Wolfe was seen today for osteoporosis.    Diagnoses and all orders for this visit:    Age-related osteoporosis without current pathological fracture  -     BONE DENSITY SPINE/HIP; Future; Expected date: 04/13/2022  -     COMPREHENSIVE METABOLIC PANEL; Future; Expected date: 04/13/2022  -     PARATHYROID HORMONE; Future; Expected date: 04/13/2022    Vitamin D deficiency    Elevated parathyroid hormone      Osteoporosis:  Contributing factors:  History of alcohol abuse  Liver cirrhosis due to hepatitis C.  Received Reclast in April 2023.  Good calcium and vitamin D replacement discussed.  Continue 1 serving of dairy daily.  Continue calcium citrate 2 tablets daily.  I think it gives him 600 mg.  Recheck PTH as it was elevated 2 months back.    Continue vitamin D 2000 units daily.          Electronically signed by Lucrezia Starch, MD 04/13/2022

## 2022-04-14 DIAGNOSIS — M810 Age-related osteoporosis without current pathological fracture: Secondary | ICD-10-CM

## 2022-07-03 ENCOUNTER — Encounter: Admit: 2022-07-03 | Discharge: 2022-07-03 | Payer: MEDICARE | Primary: Family

## 2022-09-12 ENCOUNTER — Ambulatory Visit: Admit: 2022-09-12 | Discharge: 2022-09-12 | Payer: No Typology Code available for payment source | Primary: Family

## 2022-09-12 ENCOUNTER — Encounter: Admit: 2022-09-12 | Discharge: 2022-09-12 | Payer: Medicare Other | Primary: Family

## 2022-09-12 ENCOUNTER — Encounter: Admit: 2022-09-12 | Discharge: 2022-09-12 | Payer: No Typology Code available for payment source | Primary: Family

## 2022-09-12 DIAGNOSIS — M810 Age-related osteoporosis without current pathological fracture: Secondary | ICD-10-CM

## 2022-09-12 DIAGNOSIS — Z0000 Encounter for general adult medical examination without abnormal findings: Secondary | ICD-10-CM

## 2022-09-12 DIAGNOSIS — E559 Vitamin D deficiency, unspecified: Secondary | ICD-10-CM

## 2022-09-12 DIAGNOSIS — Z87891 Personal history of nicotine dependence: Secondary | ICD-10-CM

## 2022-09-12 DIAGNOSIS — K7460 Unspecified cirrhosis of liver: Secondary | ICD-10-CM

## 2022-09-12 DIAGNOSIS — I8510 Secondary esophageal varices without bleeding: Secondary | ICD-10-CM

## 2022-09-12 DIAGNOSIS — B182 Chronic viral hepatitis C: Secondary | ICD-10-CM

## 2022-09-12 DIAGNOSIS — R600 Localized edema: Secondary | ICD-10-CM

## 2022-09-12 DIAGNOSIS — K6389 Other specified diseases of intestine: Secondary | ICD-10-CM

## 2022-09-12 DIAGNOSIS — I10 Essential (primary) hypertension: Secondary | ICD-10-CM

## 2022-09-12 DIAGNOSIS — Z87898 Personal history of other specified conditions: Secondary | ICD-10-CM

## 2022-09-12 LAB — CBC AND DIFF
ABSOLUTE BASO COUNT: 0 K/UL (ref 0–0.20)
ABSOLUTE EOS COUNT: 0.1 K/UL (ref 0–0.45)
ABSOLUTE LYMPH COUNT: 1 K/UL (ref 1.0–4.8)
ABSOLUTE MONO COUNT: 0.4 K/UL (ref 0–0.80)
ABSOLUTE NEUTROPHIL: 5.9 K/UL (ref 1.8–7.0)
BASOPHILS %: 1 % (ref 0–2)
EOSINOPHILS %: 2 % (ref >60)
HEMATOCRIT: 42 % (ref 40–50)
HEMOGLOBIN: 14 g/dL (ref 13.5–16.5)
LYMPHOCYTES %: 13 % — ABNORMAL LOW (ref 24–44)
MCH: 31 pg (ref 26–34)
MCHC: 33 g/dL (ref 32.0–36.0)
MCV: 93 FL (ref 80–100)
MONOCYTES %: 6 % (ref 4–12)
MPV: 9.1 FL (ref 7–11)
NEUTROPHILS %: 78 % — ABNORMAL HIGH (ref 41–77)
PLATELET COUNT: 103 K/UL — ABNORMAL LOW (ref 150–400)
RBC COUNT: 4.5 M/UL (ref 4.4–5.5)
RDW: 13 % (ref 11–15)
WBC COUNT: 7.7 K/UL (ref 4.5–11.0)

## 2022-09-12 LAB — POC CREATININE, RAD: CREATININE, POC: 1 mg/dL (ref 0.4–1.24)

## 2022-09-12 LAB — COMPREHENSIVE METABOLIC PANEL
POTASSIUM: 4.1 MMOL/L (ref 3.5–5.1)
SODIUM: 140 MMOL/L (ref 137–147)

## 2022-09-12 LAB — PROTIME INR (PT)
INR: 1.1 (ref 0.8–1.2)
PROTIME: 12 s (ref 9.5–14.2)

## 2022-09-12 LAB — ALPHA FETO PROTEIN (AFP), NON-PREGNANT: AFP: 6.3 ng/mL (ref 0.0–15.0)

## 2022-09-12 MED ORDER — IOHEXOL 350 MG IODINE/ML IV SOLN
100 mL | Freq: Once | INTRAVENOUS | 0 refills | Status: CP
Start: 2022-09-12 — End: ?
  Administered 2022-09-12: 16:00:00 100 mL via INTRAVENOUS

## 2022-09-12 MED ORDER — SODIUM CHLORIDE 0.9 % IJ SOLN
50 mL | Freq: Once | INTRAVENOUS | 0 refills | Status: CP
Start: 2022-09-12 — End: ?
  Administered 2022-09-12: 16:00:00 50 mL via INTRAVENOUS

## 2022-09-12 NOTE — Progress Notes
Date of Service: 09/12/2022     Subjective:             Tyler Wolfe is a 67 y.o. male.    History of Present Illness  [name] is a 67 year old male who presents today for a 6 month follow up.  As you know, 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  He is known to have evidence of decompensated disease in the past with a history of ascites/edema and EV, well managed at this time.  Comorbidities for [name] are limited, but include HTN and osteoporosis.    Interval History:  Since last being seen, [name] reports doing relatively well.  He reports no new symptoms or concerns.  He states he has been increasing his physical activity, now using a [employer].  He states his weight was actually down to 183 pounds, but with the holidays admits it has begin to climb again.  He plans to refocus on his weight loss once again.  Outside this, he reports no other changes.  He reports no hospitalizations or ER visits.    In regards to [name]'s liver disease, we are aware he has had evidence of decompensation with a history of ascites/edema and EV.  He reports his fluid status is managed with current diuretics (Amiloride 5mg  and Furosemide 40mg  daily).  He reports tolerance to his current doses with no myalgias.  He denies any episodes of hematemesis, melena, or hematochezia at this time.  He has not yet been able to complete his EGD, however is looking to reschedule now if possible.  He continues to deny any jaundice, however now admits to having some pruritus on his abdomen.  In reviewing symptoms concerning for HE, he reports no such symptom.  Today, [name] reports no abdominal pain, nausea, vomiting, hematemesis, diarrhea, melena, hematochezia, fevers, chills, fatigue, or unexpected weight loss.      As for his comorbiditities, we find he continues to follow closely with his PCP for management.  Today, he reports no chest pain, palpitations or SOB.    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  He reports he has remained free of cigarette use.  He reports his last cigarette was over 6 months ago.  He has had no relapse to alcohol with his last drink over 5 years ago.  He presents alone today.    Health Maintenance:  Colonoscopy-  2020  EGD-  2021  Influenza-  2022  COVID- last dose 07/2021  BMD- June 2022; repeat scheduled for later today         Review of Systems  A complete 10 point review of systems was negative except those listed in the HPI.    Objective:         Your Current Medications:         Instructions    aMILoride (MIDAMOR) 5 mg tablet TAKE 1 TABLET BY MOUTH EVERY DAY    Calcium Citrate-Vitamin D2 315 mg-5 mcg (200 unit) tab Take 1 tablet by mouth twice daily.    CHOLEcalciferoL (vitamin D3) (VITAMIN D3) 50 mcg (2,000 unit) tablet Take one tablet by mouth daily.    epinephrine(+) (  EPIPEN JR) 0.15 mg/0.3 mL (1:2,000) syringe Inject 0.3 mL into the muscle as Needed.    furosemide (LASIX) 40 mg tablet Take one tablet by mouth every morning.    zoledronic acid/mannitol-water (RECLAST IV) Administer  through vein. Once per year infusion @[facility]           Vitals:    09/12/22 0818   BP: 132/79   Pulse: 58   Temp: 36.6 ?C (97.8 ?F)   SpO2: 98%   PainSc: Eight   Weight: 88.9 kg (196 lb)   Height: 177.8 cm (5' 10)     Body mass index is 28.12 kg/m?Marland Kitchen    Physical Exam    Vitals reviewed.   Constitutional: Patient appears energized, well-developed, well-nourished, in no apparent distress.  Responds appropriately to questions.  Neuro:  No tremor noted.  Patient is AOx4.  No asterixis.  HEENT:  Head is normocephalic and atraumatic.  No scleral icterus noted.  Conjunctiva pink and moist.  EOM normal & PERRLA.   Neck and Lymph: Normal ROM. Neck supple.   Cardiac:  S1 and S2  Respiratory:  lung sounds clear to auscultation bilaterally.  No wheezes or crackles.  GI:  Abdomen soft, round, non-distended, non-tender. BS present. No clinically evident ascites. No hernia.  +splenomegaly     Skin:  Skin is warm, dry, and intact. Nails show no clubbing. No jaundice.  Few spider nevi on upper torso.  +palmar erythema   Peripheral Vascular: No lower extremity edema.  Musculoskeletal:  ROM intact.    Psychiatric: Normal mood and affect. Behavior is normal. Judgment and thought content normal         Labs:  Labs reviewed in O2    MELD 3.0: 7 at 02/15/2022  8:34 AM  MELD-Na: 7 at 02/15/2022  8:34 AM  Calculated from:  Serum Creatinine: 0.82 MG/DL (Using min of 1 MG/DL) at 09/16/1094  0:45 AM  Serum Sodium: 140 MMOL/L (Using max of 137 MMOL/L) at 02/15/2022  8:34 AM  Total Bilirubin: 0.8 MG/DL (Using min of 1 MG/DL) at 4/0/9811  9:14 AM  Serum Albumin: 4.6 G/DL (Using max of 3.5 G/DL) at 03/19/2955  2:13 AM  INR(ratio): 1.1 at 02/15/2022  8:34 AM  Age at listing (hypothetical): 65 years  Sex: Male at 02/15/2022  8:34 AM    Diagnostic tests:    EGD (May 2021):  - Small (< 5 mm) esophageal varices. Not banded.   - Hematin (altered blood/coffee-ground-like material) in the gastric antrum and in the  gastric body.   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   - No specimens collected.     Colonoscopy (January 2020)  Report scanned in O2 and reviewed    Imaging:    Results for orders placed during the hospital encounter of 02/15/22    US ABDOMEN LIMITED    Narrative  SCREENING ABDOMINAL ULTRASOUND    CLINICAL INDICATION: HCC screening, cirrhosis. Chronic hepatitis C.    TECHNIQUE: Multiple grayscale and color Doppler ultrasound images were obtained of the abdomen.    COMPARISON: CT abdomen 09/16/2021 and ultrasound 02/14/2021.    FINDINGS:    Liver and Biliary System:  Cirrhotic liver morphology, normal size. No focal hepatic observations. No intrahepatic bile duct dilatation. The common duct measures 0.2 cm at the porta hepatis. The gallbladder is nondilated with mobile calculi. The main portal vein is patent with antegrade flow at the porta hepatis.    Spleen: Mildly enlarged measuring 13.1  cm.    Pancreas: Visualized portions are unremarkable.    Aorta: Visualized portions are normal in caliber.    IVC: Visualized portions are normal in caliber.    Peritoneal Space:  No significant abdominopelvic ascites.    Impression  1.  US-1, negative. Visualization score B.    2.  Cirrhosis. Mild splenomegaly. No ascites.    3.  Cholelithiasis.    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  He is known to have evidence of decompensation including ascites/edema and EV, now recompensated  - We find his most recent MELD stable at 7 as of June 2023.  Discussed that given his low MELD and continued clinical stability, a liver transplant evaluation is not yet indicated.  We will continue to monitor for changes that may indicate otherwise.  - For now, we will continue to manage complications related to his liver disease accordingly.    - As for his known RF for NAFLD, we again reviewed importance of diet and weight loss.  Consider the utility of liberalizing coffee and black tea consumption as some data that this may slow progression and reverse effects of NASH-related fibrosis.  HTN/HLP management encouraged as well.  - Again reviewed all alcohol use must be avoided lifelong.  This includes all NA beer and wine as well.  Random screens will continue  - OTC herbal supplements should be avoided.  - MELD labs to be obtained today    HCC Screening:  - Secondary to cirrhosis, he is at risk for developing HCC.  As a result, we will proceed with HCC screening.  - Recommend screening for [facility] with either abdominal ultrasound or alternating abdominal ultrasound with a triple phase CT of the abdomen with IV contrast OR MRI of the abdomen with IV contrast every 6 months.  - Last abdominal imaging was done June 2023 with a CT scheduled for later today (09/12/22).  Will await results  - AFP needed every 6 months to be done at time of imaging screen.  AFP with labs today   - Reminded [name] that should he develop any new onset, unexpected weight loss, he is to notify the office for review.    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  He was referred to [facility] with a negative workup.  For now, will continue to monitor for changes with his subsequent imaging (no mass reported on last CT abdomen in January 2023).  If needed will refer back to [facility]    HCV  - [name] is known to have had HCV, treated at an outside hospital ([place]) with SVR obtained.   - Reminded [name] that although he has cleared the virus, he is still at risk for reinfection should he partake in any high risk activity.  He verbalizes understanding    Hepatic encephalopathy:   - Today, he continues to report no signs of HE.  No interventions needed for now.  - Reminded him to be mindful for any signs of confusion/fogginess in thinking, worsening malaise, or tremors that may suggest the development of HE.  Should this occur, he is to notify the office for review.    Esophageal Varices:  - Last EGD was done May 2021. Repeat EGD recommended now for screening,  orders placed.  He will call GI scheduling to re-schedule his procedure now  - If there is any evidence of medium/large esophageal varices, we would recommend esophageal varix band ligation.  If band ligation is performed, please continue to repeat (~ every 4-6 weeks) until eradication of varices.  - Reminded [name] that should he have any episodes of hematemesis or melena, he should proceed to the ER immediately.    Ascites/Edema:   - Currently controlled with use of diuretics; lasix 40mg  and amiloride 5mg  daily.  No adjustments needed.  - Encouraged him to continue with a low sodium diet <2 grams/day, with high amounts of lean protein (see below for details).  - Should he have any new onset ascites, edema, or weight gain, he is to notify the office for review.    - Reminded [name] that should he have any new onset fevers or chills, sudden sharp abdominal pain or discomfort, he should proceed to the ER to be evaluated for possible SBP.      History of alcohol abuse:    - Emphasized the need to stay abstinent from any alcohol.  Discussed this includes all NA beer and wine  - We will continue with random screening to ensure compliance    Smoking:  - Discussed importance of continued smoking cessation.  Congratulated him on his cessation thus far and encourage him to continue.  - Reviewed potential risks and complications related to cigarette use.  He verbalizes understanding of risks.    - Reviewed that if a liver transplant is needed in the future, he must be free of all nicottine    Protein Malnutrition:  - For patients with cirrhosis, it is very important to eat the right types and amounts of foods.  We recommend a diet low in carbohydrates/sugars and high in fresh fruits/vegetables, with the lean proteins; such as eggs, fish, chicken, nuts, and legumes.  We typically recommend 1.2-1.5gm/kg/day of protein, or 75-100 grams of protein every day.    - Discussed the need to minimize the intake of carbohydrates and sugars as well to avoid obesity.    - [name] should eat at least three meals a day and three to four snacks between meals  - Bedtime snacks are especially important (preferably something with some protein)    - Patients with malnutrition and/or loss of muscular mass can improve their nutrition and muscular mass by drinking two cans of dietary supplements daily, particularly at bedtime.  These would include: Ensure, Boost, Carnation Instant Milk, Glucerna, (or similar supplements), if needed to meet protein/caloric recommendations.  - Please avoid eating raw seafood, especially shellfish, because of risk of serious illness    Bone Health/Vitamin D deficiency  - Secondary to cirrhosis, [name] is at risk for Vitamin D deficiency and developing osteopenia/osteoporosis.  We will continue to monitor for changes.  - He does follow with endocrinology for management with a history of Reclast infusion x 1.  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  He is now up-to-date  - Last colonoscopy done January 2020.  Due to polyps (hepatic flexure TA, sigmoid TVA, and R-S TVA with HGD), recommend to repeat in 3 years (2023) or sooner if clinically indicated.  This can be completed with his EGD  -  Routine screening for fat soluble vitamins encouraged yearly (Vitamin A, E) with supplementation and appropriate follow up if indicated.  Will obtain with follow up labs  - All patients with cirrhosis should avoid the use of Non-steroidal Anti-Inflammatory (NSAID) medications as they can cause significant injury to the kidneys in this population.  If needed, you may use Tylenol PRN, no more than 2g/day.  - Reminded [name] that should he have any hospitalizations or ER visits, he is to notify the office as well.  - Discussed that if any elective surgeries are needed, we would recommend first reviewing with the clinic first, given his liver disease.  He does discuss the possibility of cataract surgery and reviewed this is okay from our perspective  - COVID precautions reviewed and encouraged.        Follow-up:  He is to return to [facility] Clinic in 6 months to meet with Dr. [place] and myself in 1 year.  As always, he may come sooner if a problem arises.               I am grateful for the opportunity to participate in the care of this patient.  If you have any further questions, please don't hesitate to contact our office.      _____________________________  Christen Butter, APRN-C  Hepatology & Liver Transplantation  [facility]  [phone]    Total of 40 minutes were spent on the same day of the visit including preparing to see the patient, obtaining and reviewing separately obtained history, performing a medically appropriate examination and evaluation, counseling and educating the patient of the side effect profile of cirrhosis/pathophysiology of the underlying disease process and subsequent development of portal HTN and its sequale, ordering medications, tests, or procedures for ongoing management, referring and communication with other health care professionals, documenting clinical information in the electronic or other health record, interpreting results of labs and procedures ordered and communicating results to the patient, and care coordination.

## 2022-09-12 NOTE — Telephone Encounter
NC called patient updated on lab/imaging results reviewed by [facility] APRN. Labs consistent with trends. MELD 7 with no changes to current plan. AFP and Vitamin D pending- will notify patient via mychart of results. Plan to recheck labs in 6 months.  Reviewed CT imaging stable and will plan to repeat US In 6 months. Plan for patient to have [place] and repeat labs with f/u appointment. Patient verbalized understanding.

## 2022-09-13 ENCOUNTER — Encounter: Admit: 2022-09-13 | Discharge: 2022-09-13 | Payer: No Typology Code available for payment source | Primary: Family

## 2022-09-13 NOTE — Telephone Encounter
Pt called in to cancel and reschedule upcoming June appointment due to schedule conflict. Please return Pt's call.

## 2022-09-13 NOTE — Telephone Encounter
Patient left VM stating he was supposed to have a bone density scan yesterday but it was cancelled. Requesting a call back to "get this straightened out".    Bone density scan order placed yesterday. Last bone density scan 02/14/21 at [place] 04/13/22.   Reclast 12/2021.   Future appt 10/09/22.

## 2022-09-13 NOTE — Telephone Encounter
Attempted to call patient back. Patient did not answer and does not have VM box set up.

## 2022-09-14 ENCOUNTER — Encounter: Admit: 2022-09-14 | Discharge: 2022-09-14 | Payer: No Typology Code available for payment source | Primary: Family

## 2022-09-14 ENCOUNTER — Ambulatory Visit: Admit: 2022-09-14 | Discharge: 2022-09-14 | Payer: No Typology Code available for payment source | Primary: Family

## 2022-09-17 ENCOUNTER — Encounter: Admit: 2022-09-17 | Discharge: 2022-09-17 | Payer: No Typology Code available for payment source | Primary: Family

## 2022-09-17 DIAGNOSIS — Z87898 Personal history of other specified conditions: Secondary | ICD-10-CM

## 2022-09-17 MED ORDER — AMILORIDE 5 MG PO TAB
ORAL_TABLET | 1 refills
Start: 2022-09-17 — End: ?

## 2022-09-21 ENCOUNTER — Encounter: Admit: 2022-09-21 | Discharge: 2022-09-21 | Payer: No Typology Code available for payment source | Primary: Family

## 2022-10-02 ENCOUNTER — Ambulatory Visit: Admit: 2022-10-02 | Discharge: 2022-10-02 | Payer: No Typology Code available for payment source | Primary: Family

## 2022-10-09 ENCOUNTER — Encounter: Admit: 2022-10-09 | Discharge: 2022-10-09 | Payer: No Typology Code available for payment source | Primary: Family

## 2022-10-09 ENCOUNTER — Ambulatory Visit: Admit: 2022-10-09 | Discharge: 2022-10-10 | Payer: No Typology Code available for payment source | Primary: Family

## 2022-10-09 DIAGNOSIS — E559 Vitamin D deficiency, unspecified: Secondary | ICD-10-CM

## 2022-10-09 DIAGNOSIS — M810 Age-related osteoporosis without current pathological fracture: Secondary | ICD-10-CM

## 2022-10-09 DIAGNOSIS — I10 Essential (primary) hypertension: Secondary | ICD-10-CM

## 2022-10-09 MED ORDER — ZOLEDRONIC ACID-MANNITOL-WATER 5 MG/100 ML IV PGBK
5 mg | Freq: Once | INTRAVENOUS | 0 refills
Start: 2022-10-09 — End: ?

## 2022-10-09 NOTE — Progress Notes
Chief Complaint   Patient presents with    Osteoporosis        Date of Service: 10/09/2022    Tyler Wolfe is a 67 y.o. male. DOB: 1956-04-08   MRN#: [id number]        HPI:  Osteoporosis  Dx: June 2022 when he had a bone density scan.  He has liver cirrhosis density scan was ordered by gastroenterologist.  He has never received any treatments in the past.  Lowest bone density at right femur.    Received Reclast in April 2023.  Hx of fracture/fall: 22 years back  Hip/pelvic/thigh pain:none     Secondary contributors:  Kidney stones:none  Family hx of osteoporosis: Grandmother had hip fracture  Steroid use: None  He has history of alcohol abuse.  He stopped alcohol 6 years back.  He is a non-smoker.     Lifestyle intervention:  Exercise:   Not much  Dietary calcium intake:  he has some cheese.   Calcium supplement:   Calcium citrate: 600 mg daily.  He has vitamin D deficiency on Vitamin D supplement:  He takes vitamin D 2000 units daily.        Review of Systems   Constitutional:  Negative for fever.   Cardiovascular:  Negative for chest pain.       Medical History:   Diagnosis Date    Hypertension      Surgical History:   Procedure Laterality Date    ESOPHAGOGASTRODUODENOSCOPY WITH BIOPSY - FLEXIBLE N/A 05/27/2019    Performed by Cardell Peach, MD at [facility] ENDO    ESOPHAGOGASTRODUODENOSCOPY WITH SPECIMEN COLLECTION BY BRUSHING/ WASHING N/A 01/20/2020    Performed by Cherlynn June, MD at [facility] ENDO    HAND TENDON SURGERY Left     HX KNEE SURGERY Right      Family History   Problem Relation Age of Onset    None Reported Mother     None Reported Father      Social History     Socioeconomic History    Marital status: Single   Tobacco Use    Smoking status: Former     Packs/day: .25     Types: Cigarettes     Quit date: 06/2021     Years since quitting: 1.3    Smokeless tobacco: Never   Vaping Use    Vaping Use: Never used   Substance and Sexual Activity    Alcohol use: Not Currently     Alcohol/week: 0.0 standard drinks of alcohol    Drug use: Not Currently    Sexual activity: Not Currently     Partners: Female     Birth control/protection: None       aMILoride  Calcium Citrate-Vitamin D2 Tab  CHOLEcalciferoL (vitamin D3)  EPINEPHrine  furosemide  RECLAST IV      Objective:      aMILoride (MIDAMOR) 5 mg tablet TAKE 1 TABLET BY MOUTH EVERY DAY    Calcium Citrate-Vitamin D2 315 mg-5 mcg (200 unit) tab Take 1 tablet by mouth twice daily. (Patient taking differently: Take 1 tablet by mouth daily.)    CHOLEcalciferoL (vitamin D3) (VITAMIN D3) 50 mcg (2,000 unit) tablet Take one tablet by mouth daily.    epinephrine(+) (EPIPEN JR) 0.15 mg/0.3 mL (1:2,000) syringe Inject 0.3 mL into the muscle as Needed.    furosemide (LASIX) 40 mg tablet Take one tablet by mouth every morning.    zoledronic acid/mannitol-water (RECLAST IV) Administer  through vein.  Once per year infusion @[facility]      Vitals:    10/09/22 0924   BP: 125/73   Pulse: 59   PainSc: Ten   Weight: 89.2 kg (196 lb 9.6 oz)   Height: 180.6 cm (5' 11.1)     Body mass index is 27.34 kg/m?Marland Kitchen     Physical Exam  Vitals and nursing note reviewed.   Constitutional:       General: He is not in acute distress.     Appearance: Normal appearance. He is not ill-appearing, toxic-appearing or diaphoretic.   HENT:      Head: Normocephalic and atraumatic.      Nose: Nose normal.   Eyes:      Conjunctiva/sclera: Conjunctivae normal.   Neurological:      Mental Status: He is alert and oriented to person, place, and time.   Psychiatric:         Mood and Affect: Mood normal.               Comprehensive Metabolic Profile    Lab Results   Component Value Date/Time    NA 140 09/12/2022 09:12 AM    K 4.1 09/12/2022 09:12 AM    CL 102 09/12/2022 09:12 AM    CO2 29 09/12/2022 09:12 AM    GAP 9 09/12/2022 09:12 AM    BUN 14 09/12/2022 09:12 AM    CR 1.0 09/12/2022 09:33 AM    CR 0.88 09/12/2022 09:12 AM    GLU 93 09/12/2022 09:12 AM    GLU 99 09/12/2017 09:48 AM    Lab Results   Component Value Date/Time    CA 9.8 09/12/2022 09:12 AM    ALBUMIN 4.6 09/12/2022 09:12 AM    TOTPROT 7.8 09/12/2022 09:12 AM    ALKPHOS 41 09/12/2022 09:12 AM    AST 21 09/12/2022 09:12 AM    ALT 14 09/12/2022 09:12 AM    TOTBILI 0.7 09/12/2022 09:12 AM    GFR >60 07/20/2020 10:41 AM    GFRAA >60 07/20/2020 10:41 AM        09/2022 BMD    LUMBAR SPINE, L1-L4   Current: 1.065 g/cm2, T-score of -1.4       Previous: 0.986 g/cm2, T-score of -2.0     LEFT FEMORAL NECK    Current: 0.716 g/cm2, T-score of -2.7       Previous: 0.731 g/cm2, T-score of -2.6     LEFT TOTAL HIP    Current: 0.829 g/cm2, T-score of -1.9   Previous: 0.848 g/cm2, T-score of -1.8     RIGHT FEMORAL NECK   Current: 0.743 g/cm2, T-score of -2.5       Previous: 0.695 g/cm2, T-score of -2.9     RIGHT TOTAL HIP   Current: 0.775 g/cm2, T-score of -2.3   Previous: 0.744 g/cm2, T-score of -2.5       Assessment and Plan:    Darryll Freimark. Cross was seen today for osteoporosis.    Diagnoses and all orders for this visit:    Age-related osteoporosis without current pathological fracture  -     BONE DENSITY SPINE/HIP; Future; Expected date: 09/17/2023    Vitamin D deficiency    Other orders  -     zoledronic acid/mannitol/water (RECLAST) 5mg /100 mL IVPB      Osteoporosis:  Contributing factors:  History of alcohol abuse  Liver cirrhosis due to hepatitis C.  Received Reclast in April 2023.    Bone density is stable/better.  Plan for  another Reclast infusion in April 2024.  Orders placed.    Good calcium and vitamin D replacement discussed.  Continue 1 serving of dairy daily.  Continue calcium citrate 2 tablets daily.  I think it gives him 600 mg.    Continue vitamin D 2000 units daily.          Electronically signed by Lucrezia Starch, MD 10/09/2022

## 2022-11-22 ENCOUNTER — Encounter: Admit: 2022-11-22 | Discharge: 2022-11-22 | Payer: No Typology Code available for payment source | Primary: Family

## 2022-11-23 ENCOUNTER — Encounter: Admit: 2022-11-23 | Discharge: 2022-11-23 | Payer: No Typology Code available for payment source | Primary: Family

## 2022-11-29 ENCOUNTER — Encounter: Admit: 2022-11-29 | Discharge: 2022-11-29 | Payer: No Typology Code available for payment source | Primary: Family

## 2022-12-15 ENCOUNTER — Encounter: Admit: 2022-12-15 | Discharge: 2022-12-15 | Payer: No Typology Code available for payment source | Primary: Family

## 2022-12-22 ENCOUNTER — Ambulatory Visit: Admit: 2022-12-22 | Discharge: 2022-12-23 | Payer: No Typology Code available for payment source | Primary: Family

## 2022-12-22 ENCOUNTER — Encounter: Admit: 2022-12-22 | Discharge: 2022-12-22 | Payer: No Typology Code available for payment source | Primary: Family

## 2022-12-22 MED ORDER — ZOLEDRONIC ACID-MANNITOL-WATER 5 MG/100 ML IV PGBK
5 mg | Freq: Once | INTRAVENOUS | 0 refills | Status: CN
Start: 2022-12-22 — End: ?

## 2022-12-22 MED ORDER — ZOLEDRONIC ACID-MANNITOL-WATER 5 MG/100 ML IV PGBK
5 mg | Freq: Once | INTRAVENOUS | 0 refills | Status: CP
Start: 2022-12-22 — End: ?
  Administered 2022-12-22: 15:00:00 5 mg via INTRAVENOUS

## 2022-12-22 NOTE — Progress Notes
~  1010: Reclast Infusion completed and flushed. See e-mar for administration details and Post infusion vitals completed. No concern for infusion reaction. Patient has no complaints.  PIV discontinued. Patient declined printed AVS. Prompted patient to schedule upcoming appointment(s) at infusion checkout or call infusion scheduling. Patient verbalized understanding. Patient ambulated off unit.     Vitals:    12/22/22 0917 12/22/22 1011   BP: 110/60 104/67   BP Source: Arm, Right Upper Arm, Right Upper   Pulse: 59 53   Temp: 36.7 C (98 F)    Resp: 16    SpO2: 100% 100%   O2 Device: None (Room air)    TempSrc: Skin    Weight: 86.2 kg (190 lb)    Height: 180.6 cm (5' 11.1")

## 2022-12-23 DIAGNOSIS — M810 Age-related osteoporosis without current pathological fracture: Secondary | ICD-10-CM

## 2023-01-22 ENCOUNTER — Encounter: Admit: 2023-01-22 | Discharge: 2023-01-22 | Payer: No Typology Code available for payment source | Primary: Family

## 2023-02-20 ENCOUNTER — Encounter: Admit: 2023-02-20 | Discharge: 2023-02-20 | Payer: No Typology Code available for payment source | Primary: Family

## 2023-02-20 ENCOUNTER — Ambulatory Visit: Admit: 2023-02-20 | Discharge: 2023-02-20 | Payer: No Typology Code available for payment source | Primary: Family

## 2023-02-20 DIAGNOSIS — K7460 Unspecified cirrhosis of liver: Secondary | ICD-10-CM

## 2023-02-20 DIAGNOSIS — K6389 Other specified diseases of intestine: Secondary | ICD-10-CM

## 2023-02-20 DIAGNOSIS — Z87898 Personal history of other specified conditions: Secondary | ICD-10-CM

## 2023-02-20 DIAGNOSIS — Z87891 Personal history of nicotine dependence: Secondary | ICD-10-CM

## 2023-02-20 DIAGNOSIS — R600 Localized edema: Secondary | ICD-10-CM

## 2023-02-20 DIAGNOSIS — I10 Essential (primary) hypertension: Secondary | ICD-10-CM

## 2023-02-20 DIAGNOSIS — E559 Vitamin D deficiency, unspecified: Secondary | ICD-10-CM

## 2023-02-20 DIAGNOSIS — M810 Age-related osteoporosis without current pathological fracture: Secondary | ICD-10-CM

## 2023-02-20 DIAGNOSIS — B182 Chronic viral hepatitis C: Secondary | ICD-10-CM

## 2023-02-20 DIAGNOSIS — Z0000 Encounter for general adult medical examination without abnormal findings: Secondary | ICD-10-CM

## 2023-02-20 LAB — CBC AND DIFF
ABSOLUTE BASO COUNT: 0 K/UL (ref 0–0.20)
ABSOLUTE EOS COUNT: 0.1 K/UL (ref 0–0.45)
ABSOLUTE LYMPH COUNT: 1.1 K/UL (ref 1.0–4.8)
ABSOLUTE MONO COUNT: 0.3 K/UL (ref 0–0.80)
ABSOLUTE NEUTROPHIL: 3.8 K/UL (ref 1.8–7.0)
BASOPHILS %: 1 % (ref 0–2)
EOSINOPHILS %: 2 % (ref >60)
HEMATOCRIT: 41 % (ref 40–50)
HEMOGLOBIN: 14 g/dL (ref 13.5–16.5)
LYMPHOCYTES %: 20 % — ABNORMAL LOW (ref 24–44)
MCH: 31 pg (ref 26–34)
MCHC: 34 g/dL (ref 32.0–36.0)
MPV: 8.9 FL (ref 7–11)
NEUTROPHILS %: 71 % (ref 41–77)
PLATELET COUNT: 108 K/UL — ABNORMAL LOW (ref 150–400)
RBC COUNT: 4.4 M/UL (ref 4.4–5.5)
RDW: 13 % (ref 11–15)

## 2023-02-20 LAB — ALPHA FETO PROTEIN (AFP), NON-PREGNANT: AFP: 5 ng/mL (ref 0.0–15.0)

## 2023-02-20 LAB — COMPREHENSIVE METABOLIC PANEL
ANION GAP: 9 % (ref 3–12)
CALCIUM: 9.7 mg/dL (ref 8.5–10.6)
CHLORIDE: 103 MMOL/L (ref 98–110)
POTASSIUM: 4.3 MMOL/L (ref 3.5–5.1)
SODIUM: 141 MMOL/L (ref 137–147)

## 2023-02-20 LAB — PROTIME INR (PT)
INR: 1.1 (ref 0.9–1.2)
PROTIME: 12 s (ref 10.2–12.9)

## 2023-02-20 LAB — 25-OH VITAMIN D (D2 + D3): VITAMIN D (25-OH) TOTAL: 37 ng/mL (ref 30–80)

## 2023-02-20 NOTE — Progress Notes
Date of Service: 02/20/2023     Subjective:             Tyler Wolfe is a 67 y.o. male.    History of Present Illness  [name] is a 67 year old male who presents today for a 6 month follow up.  As you know, 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  He is known to have evidence of decompensated disease in the past with a history of ascites/edema and EV, well managed at this time.  Comorbidities for [name] are limited, but include HTN and osteoporosis.    Interval History:  Since last being seen, [name] reports doing well.  He reports no changes in his liver health.  He states the only change he has noticed is a little decline in his urine output as the evening progresses, however picks back up in the morning.  He states he is unclear if it is related to medications or other lifestyle changes he has made.  He denies any dysuria or other symptoms, simply states the output is less as the day progresses.  He also updates [place] that he has continued to work on [employer] and has been mindful of staying active.  He reports no hospitalizations or ER visits    In regards to [name]'s liver disease, we are aware he has had evidence of decompensation with a history of ascites/edema and EV.  He reports his fluid status is managed with current diuretics (Amiloride 5mg  and Furosemide 40mg  daily).  He does admit to some muscle cramps from time to time in his legs and even in his sides.  He denies any episodes of hematemesis, melena, or hematochezia at this time.  He has not yet been able to complete his EGD, however is planning to reschedule his visit now.  He continues to deny any jaundice and pruritus.  In reviewing symptoms concerning for HE, he reports no such symptom.  Today, [name] reports no abdominal pain, nausea, vomiting, hematemesis, diarrhea, melena, hematochezia, fevers, chills, fatigue, or unexpected weight loss.      As for his comorbiditities, we find he continues to follow closely with his PCP for management.  Today, he reports no chest pain, palpitations or SOB.    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  He reports he has remained free of cigarette use.  He reports his last cigarette was over 6 months ago.  He has had no relapse to alcohol with his last drink over 5 years ago.  He presents alone today.    Health Maintenance:  Colonoscopy-  2020  EGD-  2021  Influenza-  2022  COVID- last dose 07/2021  BMD- January 2024         Review of Systems  A complete 10 point review of systems was negative except those listed in the HPI.    Objective:         Your Current Medications:         Instructions    aMILoride (MIDAMOR) 5 mg tablet TAKE 1 TABLET BY MOUTH EVERY DAY    Calcium Citrate-Vitamin D2 315 mg-5 mcg (200 unit) tab Take 1  tablet by mouth twice daily.    CHOLEcalciferoL (vitamin D3) (VITAMIN D3) 50 mcg (2,000 unit) tablet Take one tablet by mouth daily.    epinephrine(+) (EPIPEN JR) 0.15 mg/0.3 mL (1:2,000) syringe Inject 0.3 mL into the muscle as Needed.    furosemide (LASIX) 40 mg tablet Take one tablet by mouth every morning.    zoledronic acid/mannitol-water (RECLAST IV) Administer  through vein. Once per year infusion @[facility]           Vitals:    02/20/23 0919   BP: 119/79   BP Source: Arm, Right Upper   Pulse: 61   SpO2: 99%   PainSc: Zero   Weight: 86.7 kg (191 lb 3.2 oz)     Body mass index is 26.59 kg/m?Marland Kitchen    Physical Exam    Vitals reviewed.   Constitutional: Patient appears energized, well-developed, well-nourished, in no apparent distress.  Responds appropriately to questions.  Neuro:  No tremor noted.  Patient is AOx4.  No asterixis.  HEENT:  Head is normocephalic and atraumatic.  No scleral icterus noted.  Conjunctiva pink and moist.  EOM normal & PERRLA.   Neck and Lymph: Normal ROM. Neck supple.   Cardiac:  S1 and S2  Respiratory:  lung sounds clear to auscultation bilaterally.  No wheezes or crackles.  GI:  Abdomen soft, round, non-distended, non-tender. BS present. No clinically evident ascites. No hernia.  +splenomegaly     Skin:  Skin is warm, dry, and intact. Nails show no clubbing. No jaundice.  Few spider nevi on upper torso.  +palmar erythema   Peripheral Vascular: No lower extremity edema.  Musculoskeletal:  ROM intact.    Psychiatric: Normal mood and affect. Behavior is normal. Judgment and thought content normal         Labs:  Labs reviewed in O2    MELD 3.0: 7 at 09/12/2022  9:33 AM  MELD-Na: 7 at 09/12/2022  9:33 AM  Calculated from:  Serum Creatinine: 1.0 MG/DL at 09/16/1094  0:45 AM  Serum Sodium: 140 MMOL/L (Using max of 137 MMOL/L) at 09/12/2022  9:12 AM  Total Bilirubin: 0.7 MG/DL (Using min of 1 MG/DL) at 4/0/9811  9:14 AM  Serum Albumin: 4.6 G/DL (Using max of 3.5 G/DL) at 03/19/2955  2:13 AM  INR(ratio): 1.1 at 09/12/2022  9:12 AM  Age at listing (hypothetical): 57 years  Sex: Male at 09/12/2022  9:33 AM    Diagnostic tests:    EGD (May 2021):  - Small (< 5 mm) esophageal varices. Not banded.   - Hematin (altered blood/coffee-ground-like material) in the gastric antrum and in the  gastric body.   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   - No specimens collected.     Colonoscopy (January 2020)  Report scanned in O2 and reviewed    Imaging:    Abdominal CT (09/12/2022):  IMPRESSION     1. No evidence of hepatocellular carcinoma.     2.  Cirrhosis with portal hypertension, including small portosystemic varices.     3.  Cholelithiasis.     *Full report reviewed in O2.  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  He is known to have evidence of decompensation including ascites/edema and EV, now recompensated  - We find his most recent MELD stable at  7 as of January 2024.  Discussed that given his low MELD and continued clinical stability, a liver transplant evaluation is not yet indicated.  We will continue to monitor for changes that may indicate otherwise.  - For now, we will continue to manage complications related to his liver disease accordingly.    - As for his known RF for NAFLD, we again reviewed importance of diet and weight loss.  Consider the utility of liberalizing coffee and black tea consumption as some data that this may slow progression and reverse effects of NASH-related fibrosis.  HTN/HLP management encouraged as well.  - Again reviewed all alcohol use must be avoided lifelong.  This includes all NA beer and wine as well.  Random screens will continue  - OTC herbal supplements should be avoided.  - MELD labs to be obtained today    HCC Screening:  - Secondary to cirrhosis, he is at risk for developing HCC.  As a result, we will proceed with HCC screening.  - Recommend screening for [facility] with either abdominal ultrasound or alternating abdominal ultrasound with a triple phase CT of the abdomen with IV contrast OR MRI of the abdomen with IV contrast every 6 months.  - Last abdominal imaging was done today with results pending.  Will await results with interventions to follow  - AFP needed every 6 months to be done at time of imaging screen.  AFP with labs today   - Reminded [name] that should he develop any new onset, unexpected weight loss, he is to notify the office for review.    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  He was referred to [facility] with a negative workup.  For now, will continue to monitor for changes with his subsequent imaging (no mass reported on last CT abdomen in January 2023).  If needed will refer back to [facility]    HCV  - [name] is known to have had HCV, treated at an outside hospital ([place]) with SVR obtained.   - Reminded [name] that although he has cleared the virus, he is still at risk for reinfection should he partake in any high risk activity.  He verbalizes understanding    Hepatic encephalopathy:   - Today, he continues to report no signs of HE.  No interventions needed for now.  - Reminded him to be mindful for any signs of confusion/fogginess in thinking, worsening malaise, or tremors that may suggest the development of HE.  Should this occur, he is to notify the office for review.    Esophageal Varices:  - Last EGD was done May 2021. Repeat EGD recommended now for screening.  Orders have been placed and scheduled, however he is now needing to get it rescheduled.  He will call the office to arrange.  - If there is any evidence of medium/large esophageal varices, we would recommend esophageal varix band ligation.  If band ligation is performed, please continue to repeat (~ every 4-6 weeks) until eradication of varices.  - Reminded [name] that should he have any episodes of hematemesis or melena, he should proceed to the ER immediately.    Ascites/Edema:   - Currently controlled with use of diuretics; lasix 40mg  and amiloride 5mg  daily.  As he reports some myalgias with its use and no evidence of excess volume today, we will reduce lasix to 20mg  daily.  Should he have any return of fluid with the reduction, he  will update the office  - Encouraged him to continue with a low sodium diet <2 grams/day, with high amounts of lean protein (see below for details).  - Should he have any new onset ascites, edema, or weight gain, he is to notify the office for review.    - Reminded [name] that should he have any new onset fevers or chills, sudden sharp abdominal pain or discomfort, he should proceed to the ER to be evaluated for possible SBP.      History of alcohol abuse:    - Emphasized the need to stay abstinent from any alcohol.  Discussed this includes all NA beer and wine  - We will continue with random screening to ensure compliance    Smoking:  - Discussed importance of continued smoking cessation.  Congratulated him on his cessation thus far and encourage him to continue.  - Reviewed potential risks and complications related to cigarette use.  He verbalizes understanding of risks.    - Reviewed that if a liver transplant is needed in the future, he must be free of all nicottine    Protein Malnutrition:  - For patients with cirrhosis, it is very important to eat the right types and amounts of foods.  We recommend a diet low in carbohydrates/sugars and high in fresh fruits/vegetables, with the lean proteins; such as eggs, fish, chicken, nuts, and legumes.  We typically recommend 1.2-1.5gm/kg/day of protein, or 75-100 grams of protein every day.    - Discussed the need to minimize the intake of carbohydrates and sugars as well to avoid obesity.    - [name] should eat at least three meals a day and three to four snacks between meals  - Bedtime snacks are especially important (preferably something with some protein)    - Patients with malnutrition and/or loss of muscular mass can improve their nutrition and muscular mass by drinking two cans of dietary supplements daily, particularly at bedtime.  These would include: Ensure, Boost, Carnation Instant Milk, Glucerna, (or similar supplements), if needed to meet protein/caloric recommendations.  - Please avoid eating raw seafood, especially shellfish, because of risk of serious illness    Bone Health/Vitamin D deficiency  - Secondary to cirrhosis, [name] is at risk for Vitamin D deficiency and developing osteopenia/osteoporosis.  We will continue to monitor for changes.  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  He is now up-to-date  - Last colonoscopy done January 2020.  Due to polyps (hepatic flexure TA, sigmoid TVA, and R-S TVA with HGD), recommend to repeat in 3 years (2023) or sooner if clinically indicated.  This can be completed with his EGD when he gets it rescheduled  - Routine screening for fat soluble vitamins encouraged yearly (Vitamin A, E) with supplementation and appropriate follow up if indicated.  Will obtain with follow up labs  - All patients with cirrhosis should avoid the use of Non-steroidal Anti-Inflammatory (NSAID) medications as they can cause significant injury to the kidneys in this population.  If needed, you may use Tylenol PRN, no more than 2g/day.  - Reminded [name] that should he have any hospitalizations or ER visits, he is to notify the office as well.  - Discussed that if any elective surgeries are needed, we would recommend first  reviewing with the clinic first, given his liver disease.  He does discuss the possibility of cataract surgery and reviewed this is okay from our perspective  - COVID precautions reviewed and encouraged.        Follow-up:  He is to return to [facility] in 6 months to meet with Dr. Tasia Catchings and myself in 1 year.  As always, he may come sooner if a problem arises.               I am grateful for the opportunity to participate in the care of this patient.  If you have any further questions, please don't hesitate to contact our office.      _____________________________  Christen Butter, APRN-C  Hepatology & Liver Transplantation  [facility]  [phone]    Total of 40 minutes were spent on the same day of the visit including preparing to see the patient, obtaining and reviewing separately obtained history, performing a medically appropriate examination and evaluation, counseling and educating the patient of the side effect profile of cirrhosis/pathophysiology of the underlying disease process and subsequent development of portal HTN and its sequale, ordering medications, tests, or procedures for ongoing management, referring and communication with other health care professionals, documenting clinical information in the electronic or other health record, interpreting results of labs and procedures ordered and communicating results to the patient, and care coordination.

## 2023-02-22 ENCOUNTER — Encounter: Admit: 2023-02-22 | Discharge: 2023-02-22 | Payer: No Typology Code available for payment source | Primary: Family

## 2023-03-16 ENCOUNTER — Encounter: Admit: 2023-03-16 | Discharge: 2023-03-16 | Payer: No Typology Code available for payment source | Primary: Family

## 2023-03-16 DIAGNOSIS — Z87898 Personal history of other specified conditions: Secondary | ICD-10-CM

## 2023-03-16 MED ORDER — AMILORIDE 5 MG PO TAB
ORAL_TABLET | ORAL | 1 refills | 90.00000 days | Status: AC
Start: 2023-03-16 — End: ?

## 2023-05-17 ENCOUNTER — Encounter: Admit: 2023-05-17 | Discharge: 2023-05-17 | Payer: No Typology Code available for payment source | Primary: Family

## 2023-05-25 ENCOUNTER — Encounter: Admit: 2023-05-25 | Discharge: 2023-05-25 | Payer: No Typology Code available for payment source | Primary: Family

## 2023-08-15 ENCOUNTER — Encounter: Admit: 2023-08-15 | Discharge: 2023-08-15 | Payer: MEDICARE | Primary: Family

## 2023-08-31 ENCOUNTER — Encounter: Admit: 2023-08-31 | Discharge: 2023-08-31 | Payer: MEDICARE | Primary: Family

## 2023-09-13 ENCOUNTER — Encounter: Admit: 2023-09-13 | Discharge: 2023-09-13 | Payer: MEDICARE | Primary: Family

## 2023-09-13 DIAGNOSIS — Z87898 Personal history of other specified conditions: Secondary | ICD-10-CM

## 2023-09-13 MED ORDER — AMILORIDE 5 MG PO TAB
ORAL_TABLET | ORAL | 1 refills | 90.00000 days | Status: AC
Start: 2023-09-13 — End: ?

## 2023-09-13 NOTE — Telephone Encounter
Appointment question:                                      Are you calling to cancel an upcoming appointment? Yes   What is the reason for cancelling the appointment? Fever  Do you want to reschedule the appointment? Yes   What is the best number to reach you?  [phone] he is asking for a call in the middle of the month.

## 2023-09-13 NOTE — Telephone Encounter
Patient called not feeling well didn't reschedule wants a call back later in month

## 2023-09-19 ENCOUNTER — Encounter: Admit: 2023-09-19 | Discharge: 2023-09-19 | Payer: MEDICARE | Primary: Family

## 2023-09-19 DIAGNOSIS — K7460 Unspecified cirrhosis of liver: Secondary | ICD-10-CM

## 2023-09-19 NOTE — Telephone Encounter
NC placed orders for patient to have labs completed.

## 2023-09-19 NOTE — Telephone Encounter
Pt called and wanted to get his lab work done tomorrow.  He will be going to the walk in lab at [facility]. Please put in order

## 2023-09-26 ENCOUNTER — Encounter: Admit: 2023-09-26 | Discharge: 2023-09-26 | Payer: MEDICARE | Primary: Family

## 2023-09-26 ENCOUNTER — Ambulatory Visit: Admit: 2023-09-26 | Discharge: 2023-09-26 | Payer: MEDICARE | Primary: Family

## 2023-09-28 ENCOUNTER — Encounter: Admit: 2023-09-28 | Discharge: 2023-09-28 | Payer: MEDICARE | Primary: Family

## 2023-09-28 ENCOUNTER — Ambulatory Visit: Admit: 2023-09-28 | Discharge: 2023-09-29 | Payer: MEDICARE | Primary: Family

## 2023-09-28 DIAGNOSIS — M810 Age-related osteoporosis without current pathological fracture: Secondary | ICD-10-CM

## 2023-09-28 DIAGNOSIS — R7989 Other specified abnormal findings of blood chemistry: Secondary | ICD-10-CM

## 2023-09-28 MED ORDER — ZOLEDRONIC ACID-MANNITOL-WATER 5 MG/100 ML IV PGBK
5 mg | Freq: Once | INTRAVENOUS | 0 refills
Start: 2023-09-28 — End: ?

## 2023-09-28 NOTE — Progress Notes
Chief Complaint   Patient presents with    Osteoporosis        Date of Service: 09/28/2023    Tyler Wolfe is a 68 y.o. male. DOB: 03-28-1956   MRN#: [id number]        HPI:  Osteoporosis  Dx: June 2022 when he had a bone density scan.  He has liver cirrhosis density scan was ordered by gastroenterologist.  He has never received any treatments in the past.  Lowest bone density at right femur.    Received Reclast in April 2023, 12/2022.  Hx of fracture/fall: 22 years back  Hip/pelvic/thigh pain:none     Secondary contributors:  Kidney stones:none  Family hx of osteoporosis: Grandmother had hip fracture  Steroid use: None  He has history of alcohol abuse.  He stopped alcohol 6 years back.  He is a non-smoker.     Lifestyle intervention:  Exercise:   Not much  Dietary calcium intake:  he has some cheese.   Calcium supplement:  1 tablet Calcium citrate: 300 mg daily.  He has vitamin D deficiency on Vitamin D supplement:  He takes vitamin D 2000 units daily.        Review of Systems   Constitutional:  Negative for fever.   Cardiovascular:  Negative for chest pain.       Past Medical History:    Hypertension     Surgical History:   Procedure Laterality Date    ESOPHAGOGASTRODUODENOSCOPY WITH BIOPSY - FLEXIBLE N/A 05/27/2019    Performed by Cardell Peach, MD at [facility] ENDO    ESOPHAGOGASTRODUODENOSCOPY WITH SPECIMEN COLLECTION BY BRUSHING/ WASHING N/A 01/20/2020    Performed by Cherlynn June, MD at [facility] ENDO    HAND TENDON SURGERY Left     HX KNEE SURGERY Right      Family History   Problem Relation Name Age of Onset    None Reported Mother      None Reported Father       Social History     Socioeconomic History    Marital status: Single   Tobacco Use    Smoking status: Former     Current packs/day: 0.00     Types: Cigarettes     Quit date: 06/2021     Years since quitting: 2.2    Smokeless tobacco: Never   Vaping Use    Vaping status: Never Used   Substance and Sexual Activity    Alcohol use: Not Currently Alcohol/week: 0.0 standard drinks of alcohol    Drug use: Not Currently    Sexual activity: Not Currently     Partners: Female     Birth control/protection: None       aMILoride  Calcium Citrate-Vitamin D2 Tab  CHOLEcalciferoL (vitamin D3)  EPINEPHrine  furosemide  RECLAST IV      Objective:      aMILoride (MIDAMOR) 5 mg tablet TAKE 1 TABLET BY MOUTH EVERY DAY    Calcium Citrate-Vitamin D2 315 mg-5 mcg (200 unit) tab Take 1 tablet by mouth twice daily. (Patient taking differently: Take 1 tablet by mouth daily.)    CHOLEcalciferoL (vitamin D3) (VITAMIN D3) 50 mcg (2,000 unit) tablet Take one tablet by mouth daily.    epinephrine(+) (EPIPEN JR) 0.15 mg/0.3 mL (1:2,000) syringe Inject 0.3 mL into the muscle as Needed.    furosemide (LASIX) 40 mg tablet Take one tablet by mouth every morning.    zoledronic acid/mannitol-water (RECLAST IV) Administer  through vein. Once per  year infusion @[facility]      There were no vitals filed for this visit.    There is no height or weight on file to calculate BMI.     Physical Exam  Vitals and nursing note reviewed.   Constitutional:       General: He is not in acute distress.     Appearance: Normal appearance. He is not ill-appearing, toxic-appearing or diaphoretic.   HENT:      Head: Normocephalic and atraumatic.      Nose: Nose normal.   Eyes:      Conjunctiva/sclera: Conjunctivae normal.   Neurological:      Mental Status: He is alert and oriented to person, place, and time.   Psychiatric:         Mood and Affect: Mood normal.               Comprehensive Metabolic Profile    Lab Results   Component Value Date/Time    NA 141 02/20/2023 09:03 AM    K 4.3 02/20/2023 09:03 AM    CL 103 02/20/2023 09:03 AM    CO2 29 02/20/2023 09:03 AM    GAP 9 02/20/2023 09:03 AM    BUN 12 02/20/2023 09:03 AM    CR 0.90 02/20/2023 09:03 AM    GLU 92 02/20/2023 09:03 AM    Lab Results   Component Value Date/Time    CA 9.7 02/20/2023 09:03 AM    ALBUMIN 4.4 02/20/2023 09:03 AM    TOTPROT 7.6 02/20/2023 09:03 AM    ALKPHOS 36 02/20/2023 09:03 AM    AST 22 02/20/2023 09:03 AM    ALT 14 02/20/2023 09:03 AM    TOTBILI 0.7 02/20/2023 09:03 AM    GFR >60 07/20/2020 10:41 AM    GFRAA >60 07/20/2020 10:41 AM        09/2022 BMD    LUMBAR SPINE, L1-L4   Current: 1.065 g/cm2, T-score of -1.4       Previous: 0.986 g/cm2, T-score of -2.0     LEFT FEMORAL NECK    Current: 0.716 g/cm2, T-score of -2.7       Previous: 0.731 g/cm2, T-score of -2.6     LEFT TOTAL HIP    Current: 0.829 g/cm2, T-score of -1.9   Previous: 0.848 g/cm2, T-score of -1.8     RIGHT FEMORAL NECK   Current: 0.743 g/cm2, T-score of -2.5       Previous: 0.695 g/cm2, T-score of -2.9     RIGHT TOTAL HIP   Current: 0.775 g/cm2, T-score of -2.3   Previous: 0.744 g/cm2, T-score of -2.5       Assessment and Plan:    Tyler Wolfe. Tyler Wolfe was seen today for osteoporosis.    Diagnoses and all orders for this visit:    Age-related osteoporosis without current pathological fracture    Vitamin D deficiency  -     25-OH VITAMIN D (D2 + D3); Future; Expected date: 09/28/2023    Elevated parathyroid hormone  -     PARATHYROID HORMONE; Future; Expected date: 09/28/2023    Other orders  -     zoledronic acid/mannitol/water (RECLAST) 5mg /100 mL IVPB        Osteoporosis:  Contributing factors:  History of alcohol abuse  Liver cirrhosis due to hepatitis C.  Received Reclast in April 2024.  Will get bone density results.    Bone density is stable/better.  Plan for another Reclast infusion in April 2025.  Orders placed.  Good calcium and vitamin D replacement discussed.  Continue 1 serving of dairy daily.  Continue calcium citrate 1 tablets daily.  It gives him 300 mg.    Continue vitamin D 2000 units daily.  Check D Level.        Electronically signed by Lucrezia Starch, MD 09/28/2023

## 2023-09-29 DIAGNOSIS — E559 Vitamin D deficiency, unspecified: Secondary | ICD-10-CM

## 2023-10-02 ENCOUNTER — Encounter: Admit: 2023-10-02 | Discharge: 2023-10-02 | Payer: MEDICARE | Primary: Family

## 2023-10-08 ENCOUNTER — Encounter: Admit: 2023-10-08 | Discharge: 2023-10-08 | Payer: MEDICARE | Primary: Family

## 2023-10-26 ENCOUNTER — Ambulatory Visit: Admit: 2023-10-26 | Discharge: 2023-10-26 | Payer: MEDICARE | Primary: Family

## 2023-10-26 ENCOUNTER — Ambulatory Visit: Admit: 2023-10-26 | Discharge: 2023-10-27 | Payer: MEDICARE | Primary: Family

## 2023-10-26 ENCOUNTER — Encounter: Admit: 2023-10-26 | Discharge: 2023-10-26 | Payer: MEDICARE | Primary: Family

## 2023-12-25 ENCOUNTER — Encounter: Admit: 2023-12-25 | Discharge: 2023-12-25 | Payer: MEDICARE | Primary: Family

## 2023-12-25 ENCOUNTER — Ambulatory Visit: Admit: 2023-12-25 | Discharge: 2023-12-25 | Payer: MEDICARE | Primary: Family

## 2023-12-25 MED ORDER — ZOLEDRONIC ACID-MANNITOL-WATER 5 MG/100 ML IV PGBK
5 mg | Freq: Once | INTRAVENOUS | 0 refills | Status: CN
Start: 2023-12-25 — End: ?

## 2023-12-25 MED ORDER — ZOLEDRONIC ACID-MANNITOL-WATER 5 MG/100 ML IV PGBK
5 mg | Freq: Once | INTRAVENOUS | 0 refills | Status: CP
Start: 2023-12-25 — End: ?
  Administered 2023-12-25: 15:00:00 5 mg via INTRAVENOUS

## 2023-12-25 NOTE — Patient Instructions
 Please see your provider to get a new order for Reclast.    Please have a BMP lab drawn within 60 days of your next Reclast appointment.    It is recommended that patients continue to take supplemental calcium and vitamin D.    Please let your dentist know that you are on Reclast prior to each dentist visit.

## 2023-12-26 DIAGNOSIS — M810 Age-related osteoporosis without current pathological fracture: Secondary | ICD-10-CM

## 2024-01-28 ENCOUNTER — Encounter: Admit: 2024-01-28 | Discharge: 2024-01-28 | Payer: MEDICARE | Primary: Family

## 2024-02-11 ENCOUNTER — Encounter: Admit: 2024-02-11 | Discharge: 2024-02-11 | Payer: MEDICARE | Primary: Family

## 2024-02-20 ENCOUNTER — Encounter: Admit: 2024-02-20 | Discharge: 2024-02-20 | Payer: MEDICARE | Primary: Family

## 2024-02-20 ENCOUNTER — Ambulatory Visit: Admit: 2024-02-20 | Discharge: 2024-02-20 | Payer: MEDICARE | Primary: Family

## 2024-02-20 DIAGNOSIS — I8510 Secondary esophageal varices without bleeding: Secondary | ICD-10-CM

## 2024-02-20 DIAGNOSIS — K7460 Unspecified cirrhosis of liver: Secondary | ICD-10-CM

## 2024-02-20 DIAGNOSIS — K6389 Other specified diseases of intestine: Secondary | ICD-10-CM

## 2024-02-20 DIAGNOSIS — Z0000 Encounter for general adult medical examination without abnormal findings: Secondary | ICD-10-CM

## 2024-02-20 DIAGNOSIS — R600 Localized edema: Secondary | ICD-10-CM

## 2024-02-20 DIAGNOSIS — Z87891 Personal history of nicotine dependence: Secondary | ICD-10-CM

## 2024-02-20 DIAGNOSIS — E559 Vitamin D deficiency, unspecified: Secondary | ICD-10-CM

## 2024-02-20 DIAGNOSIS — D126 Benign neoplasm of colon, unspecified: Secondary | ICD-10-CM

## 2024-02-20 DIAGNOSIS — E46 Unspecified protein-calorie malnutrition: Secondary | ICD-10-CM

## 2024-02-20 MED ORDER — FUROSEMIDE 20 MG PO TAB
20 mg | Freq: Every morning | ORAL | 0 refills | 90.00000 days | Status: AC
Start: 2024-02-20 — End: ?

## 2024-02-20 NOTE — Progress Notes
 Date of Service: 02/20/2024     Subjective:             Tyler Wolfe is a 68 y.o. male.    History of Present Illness  [name] is a 68 year old male who presents today for an overdue follow up.  He was last evaluated in June 2024.  As you know, 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  He is known to have evidence of decompensated disease in the past with a history of ascites/edema and EV, well managed at this time.  Comorbidities for [name] are limited, but include HTN and osteoporosis.    Interval History:  Since last being seen, [name] reports doing well.  We see his weight is down ~10 pounds since our last visit, attributed to increased physical activity.  He states he is now exercising daily at least 30 minutes each session.  As for his liver disease, he reports no specific questions or concerns.  He reports no hospitalizations or ER visits.    In regards to [name]'s liver disease, we are aware he has had evidence of decompensation with a history of ascites/edema and EV.  He reports his fluid status is managed with current diuretics (Amiloride 5mg  and Furosemide 40mg  daily).  He does report some muscle cramps in his legs and sides (also reported last year).  He denies any episodes of hematemesis, melena, or hematochezia at this time.  He has not yet been able to complete his EGD (or colonoscopy) due to transportation issues.  He continues to deny any jaundice and pruritus.  In reviewing symptoms concerning for HE, he reports no such symptom.  Today, [name] reports no abdominal pain, nausea, vomiting, hematemesis, diarrhea, melena, hematochezia, fevers, chills, fatigue, or unexpected weight loss.      As for his comorbiditities, we find he continues to follow closely with his PCP for management.  Today, he reports no chest pain, palpitations or SOB.    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  He reports he has remained free of cigarette use.  He reports his last cigarette was over a year ago.  He has had no relapse to alcohol with his last drink over 5 years ago (August 2017)  He presents alone today.    Health Maintenance:  Colonoscopy-  2020  EGD-  2021  Influenza-  2022  COVID- last dose 07/2021  BMD- January 2024         Review of Systems  A complete 10 point review of systems was negative except those listed in the HPI.    Objective:         Your Current Medications:         Instructions    aMILoride (MIDAMOR) 5 mg tablet TAKE 1 TABLET BY MOUTH EVERY DAY    Calcium Citrate-Vitamin D2 315 mg-5 mcg (200 unit) tab Take 1 tablet by mouth twice daily.    CHOLEcalciferoL (vitamin D3) (VITAMIN D3) 50 mcg (2,000 unit) tablet Take one tablet by mouth daily.    epinephrine(+) (EPIPEN JR) 0.15 mg/0.3 mL (1:2,000) syringe Inject 0.3 mL into the muscle as Needed.    furosemide (  LASIX) 40 mg tablet Take one tablet by mouth every morning.    zoledronic acid/mannitol-water (RECLAST IV) Administer  through vein. Once per year infusion @[facility]           Vitals:    02/20/24 1006   BP: 113/68   BP Source: Arm, Right Upper   Pulse: 57   Temp: Comment: drinking coffee   SpO2: 99%   PainSc: Three   Weight: 83.1 kg (183 lb 3.2 oz)     Body mass index is 25.48 kg/m?Aaron Aas    Physical Exam    Vitals reviewed.   Constitutional: Patient appears energized, well-developed, well-nourished, in no apparent distress.  Responds appropriately to questions.  Neuro:  No tremor noted.  Patient is AOx4.  No asterixis.  HEENT:  Head is normocephalic and atraumatic.  No scleral icterus noted.  Conjunctiva pink and moist.  EOM normal & PERRLA.   Neck and Lymph: Normal ROM. Neck supple.   Cardiac:  S1 and S2  Respiratory:  lung sounds clear to auscultation bilaterally.  No wheezes or crackles.  GI:  Abdomen soft, round, non-distended, non-tender. BS present. No clinically evident ascites. No hernia.  +splenomegaly     Skin:  Skin is warm, dry, and intact. Nails show no clubbing. No jaundice.  Few spider nevi on upper torso.  +palmar erythema   Peripheral Vascular: No lower extremity edema.  Musculoskeletal:  ROM intact.    Psychiatric: Normal mood and affect. Behavior is normal. Judgment and thought content normal    Labs:  Labs reviewed in O2    MELD 3.0: 7 at 10/26/2023 11:03 AM  MELD-Na: 7 at 10/26/2023 11:03 AM  Calculated from:  Serum Creatinine: 0.88 mg/dL (Using min of 1 mg/dL) at 1/61/0960 45:40 AM  Serum Sodium: 141 mmol/L (Using max of 137 mmol/L) at 10/26/2023 11:03 AM  Total Bilirubin: 0.8 mg/dL (Using min of 1 mg/dL) at 9/81/1914 78:29 AM  Serum Albumin: 4.5 g/dL (Using max of 3.5 g/dL) at 5/62/1308 65:78 AM  INR(ratio): 1.1 at 10/26/2023 11:03 AM  Age at listing (hypothetical): 11 years  Sex: Male at 10/26/2023 11:03 AM    Diagnostic tests:    EGD (May 2021):  - Small (< 5 mm) esophageal varices. Not banded.   - Hematin (altered blood/coffee-ground-like material) in the gastric antrum and in the  gastric body.   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   - No specimens collected.     Colonoscopy (January 2020)  Report scanned in O2 and reviewed    Imaging:    Abdominal CT (10/26/2023):    FINDINGS:    Lower thorax: Heart size normal. Coronary atherosclerosis. Lung bases  clear.    Liver and biliary system: The liver is cirrhotic. Cholelithiasis without  biliary ductal dilation. The major portal veins are patent.    Hepatic observations: No arterial phase hyperenhancing/rapid washout  lesion is identified suggest hepatocellular carcinoma.    Spleen: Unremarkable    Adrenal glands and kidneys: Unremarkable    Pancreas and retroperitoneum: Pancreas unremarkable. No retroperitoneal  lymphadenopathy.    Abdominal aorta and major vessels: The abdominal aorta is normal in  caliber with moderate atherosclerosis.    Bowel, mesentery, and peritoneal space: The visualized abdominal bowel  loops are nondistended. Trace ascites and mild mesenteric edema.    Osseous structures and abdominal wall: No destructive osseous lesion.  Small umbilical hernia containing ascitic fluid.    IMPRESSION    1. No enhancing  hepatic lesion to suggest hepatocellular carcinoma.  2. Cirrhosis with trace ascites.     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  He is known to have evidence of decompensation including ascites/edema and EV, now recompensated  - We find his most recent MELD stable at 7 as of February 2025.  Discussed that given his low MELD and continued clinical stability, a liver transplant evaluation is not yet indicated.  We will continue to monitor for changes that may indicate otherwise.  - For now, we will continue to manage complications related to his liver disease accordingly.    - As for his known RF for MASLD, we again reviewed importance of lifestyle modifications.  Consider the utility of liberalizing coffee and black tea consumption as some data that this may slow progression and reverse effects of MASH-related fibrosis.  HTN/HLP management encouraged as well.  - Again reviewed all alcohol use must be avoided lifelong.  This includes all NA beer and wine as well.  Random screens will continue  - OTC herbal supplements should be avoided.  - MELD labs to be obtained today    HCC Screening:  - Secondary to cirrhosis, he is at risk for developing HCC.  As a result, we will proceed with HCC screening.  - Recommend screening for [facility] with either abdominal ultrasound or alternating abdominal ultrasound with a triple phase CT of the abdomen with IV contrast OR MRI of the abdomen with IV contrast every 6 months.  - Last abdominal imaging was done February 2025 with no concerning lesions.  Repeat imaging with an US  in 6 months  - AFP needed every 6 months to be done at time of imaging screen.  AFP with labs today   - Reminded [name] that should he develop any new onset, unexpected weight loss, he is to notify the office for review.    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  He was referred to [facility] with a negative workup.  For now, will continue to monitor for changes with his subsequent imaging (no mass reported on last CT abdomen in February 2025).  If needed will refer back to [facility]    HCV  - [name] is known to have had HCV, treated at an outside hospital ([place]) with SVR obtained.   - Reminded [name] that although he has cleared the virus, he is still at risk for reinfection should he partake in any high risk activity.  He verbalizes understanding    Hepatic encephalopathy:   - Today, he continues to report no signs of HE.  No interventions needed for now.  - Reminded him to be mindful for any signs of confusion/fogginess in thinking, worsening malaise, or tremors that may suggest the development of HE.  Should this occur, he is to notify the office for review.    Esophageal Varices:  - Last EGD was done May 2021. Repeat EGD recommended now for screening.  Orders have been placed and scheduled, however he has had troubles completing due to transportation issues.  We discussed the option for hospital admission to complete both EGD and colonoscopy, however will first check with his insurance.  In the meantime, he is to continue checking with friends/family to see if anyone can help with his transportation issues  - If there is  any evidence of medium/large esophageal varices, we would recommend esophageal varix band ligation.  If band ligation is performed, please continue to repeat (~ every 4-6 weeks) until eradication of varices.  - Reminded [name] that should he have any episodes of hematemesis or melena, he should proceed to the ER immediately.    Ascites/Edema:   - Currently controlled with use of diuretics; lasix 40mg  and amiloride 5mg  daily.  As he reports some myalgias with its use and no evidence of excess volume today, we will reduce lasix to 20mg  daily.  Should he have any return of fluid with the reduction, he will update the office  - Encouraged him to continue with a low sodium diet <2 grams/day, with high amounts of lean protein (see below for details).  - Should he have any new onset ascites, edema, or weight gain, he is to notify the office for review.    - Reminded [name] that should he have any new onset fevers or chills, sudden sharp abdominal pain or discomfort, he should proceed to the ER to be evaluated for possible SBP.      History of alcohol abuse:    - Emphasized the need to stay abstinent from any alcohol.  Discussed this includes all NA beer and wine  - We will continue with random screening to ensure compliance    Smoking:  - Discussed importance of continued smoking cessation.  Congratulated him on his cessation thus far and encourage him to continue.  - Reviewed potential risks and complications related to cigarette use.  He verbalizes understanding of risks.    - Reviewed that if a liver transplant is needed in the future, he must be free of all nicottine    Protein Malnutrition:  - For patients with cirrhosis, it is very important to eat the right types and amounts of foods.  We recommend a diet low in carbohydrates/sugars and high in fresh fruits/vegetables, with the lean proteins; such as eggs, fish, chicken, nuts, and legumes.  We typically recommend 1.2-1.5gm/kg/day of protein, or 75-100 grams of protein every day.    - Discussed the need to minimize the intake of carbohydrates and sugars as well to avoid obesity.    - [name] should eat at least three meals a day and three to four snacks between meals  - Bedtime snacks are especially important (preferably something with some protein)    - Patients with malnutrition and/or loss of muscular mass can improve their nutrition and muscular mass by drinking two cans of dietary supplements daily, particularly at bedtime.  These would include: Ensure, Boost, Carnation Instant Milk, Glucerna, (or similar supplements), if needed to meet protein/caloric recommendations.  - Please avoid eating raw seafood, especially shellfish, because of risk of serious illness    Bone Health/Vitamin D deficiency  - Secondary to cirrhosis, [name] is at risk for Vitamin D deficiency and developing osteopenia/osteoporosis.  We will continue to monitor for changes.  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  He is now up-to-date  - Last colonoscopy done January 2020.  Due to polyps (hepatic flexure TA, sigmoid TVA, and R-S TVA with HGD), recommend to repeat in 3 years (2023) or sooner if clinically indicated.  We did review completing this with his EGD  and he is agreeable if/when he can arrange for transportation  - Routine screening for fat soluble vitamins encouraged yearly (Vitamin A, E) with supplementation and appropriate follow up if indicated.  Will obtain with follow up labs  - All patients with cirrhosis should avoid the use of Non-steroidal Anti-Inflammatory (NSAID) medications as they can cause significant injury to the kidneys in this population.  If needed, you may use Tylenol PRN, no more than 2g/day.  - Reminded [name] that should he have any hospitalizations or ER visits, he is to notify the office as well.  - Discussed that if any elective surgeries are needed, we would recommend first reviewing with the clinic first, given his liver disease.  He does discuss the possibility of cataract surgery and reviewed this is okay from our perspective  - COVID precautions reviewed and encouraged.        Follow-up:  To help coordinate his visits, he requests his next visit to be timed with next imaging.  We will plan to have him back in August 2025 with his next abdominal US .  As always, he may come sooner if a problem arises.               I am grateful for the opportunity to participate in the care of this patient.  If you have any further questions, please don't hesitate to contact our office.      _____________________________  Benjamin Brands, APRN-C  Hepatology & Liver Transplantation  [place]  [facility]  [phone]    Total of 40 minutes were spent on the same day of the visit including preparing to see the patient, obtaining and reviewing separately obtained history, performing a medically appropriate examination and evaluation, counseling and educating the patient of the side effect profile of cirrhosis/pathophysiology of the underlying disease process and subsequent development of portal HTN and its sequale, role of a liver transplant and current barriers (low MELD, need for social support, thorough cardiac evaluation in the setting of cigarette use), ordering medications, tests, or procedures for ongoing management, referring and communication with other health care professionals, documenting clinical information in the electronic or other health record, interpreting results of labs and procedures ordered and communicating results to the patient, and care coordination.

## 2024-03-12 ENCOUNTER — Encounter: Admit: 2024-03-12 | Discharge: 2024-03-12 | Payer: MEDICARE | Primary: Family

## 2024-04-22 ENCOUNTER — Encounter: Admit: 2024-04-22 | Discharge: 2024-04-22 | Payer: MEDICARE | Primary: Family

## 2024-04-23 ENCOUNTER — Encounter: Admit: 2024-04-23 | Discharge: 2024-04-23 | Payer: MEDICARE | Primary: Family

## 2024-04-23 NOTE — Telephone Encounter
 Social Work Note          Plan:  contact pt regarding local financial/insurance resources       Intervention:  SW informed pt canceled an appointment and US  because he is unable to afford the copays. SW requested to contact pt with local resources regarding lower OOP cost for care.  SW reviewed EMR.  SW looked up local resources close to pt for imaging/GI care.  SW called and spoke with pt.  SW discussed reason for call. SW discussed possibility of getting imaging/scans at [facility].  Pt states he cannot afford any copays. He states he is on a very tight budget.  He states he is established with the [facility] and is going to call and schedule an appointment to try and get some of this stuff done through [facility].  SW explained maybe the [facility] would [facility] his [facility] at [facility].  Pt states he will look into it.  He asked the [facility] nurse call him regarding sending lab request to his PCP and he can get labs there.  SW agreed to pass the message along.  Pt states he is unsure why his copays have increased.  SW encouraged pt to call [facility] and talk to his CM regarding the issue.  SW answered all questions and requested pt call with future questions/concerns and when things with [facility] are worked out so they can cover pts care at [facility].  Pt v/u.  NC updated.      Josiel Gahm, LSCSW

## 2024-06-03 ENCOUNTER — Encounter: Admit: 2024-06-03 | Discharge: 2024-06-03 | Payer: MEDICARE | Primary: Family

## 2024-06-03 DIAGNOSIS — M810 Age-related osteoporosis without current pathological fracture: Principal | ICD-10-CM

## 2024-06-03 DIAGNOSIS — Z5181 Encounter for therapeutic drug level monitoring: Secondary | ICD-10-CM

## 2024-06-03 NOTE — Telephone Encounter
 Pt called to make osteo/dexa follow up appointments.  Pt is on Reclast . Last DEXA 09/26/23.   Appointments scheduled.

## 2024-07-02 ENCOUNTER — Encounter: Admit: 2024-07-02 | Discharge: 2024-07-02 | Payer: MEDICARE | Primary: Family

## 2024-09-01 ENCOUNTER — Encounter: Admit: 2024-09-01 | Discharge: 2024-09-01 | Payer: MEDICARE | Primary: Family

## 2024-09-02 ENCOUNTER — Encounter: Admit: 2024-09-02 | Discharge: 2024-09-02 | Payer: MEDICARE | Primary: Family

## 2024-09-30 ENCOUNTER — Encounter: Admit: 2024-09-30 | Discharge: 2024-09-30 | Payer: MEDICARE | Primary: Family

## 2024-09-30 ENCOUNTER — Ambulatory Visit: Admit: 2024-09-30 | Discharge: 2024-10-01 | Payer: MEDICARE | Primary: Family

## 2024-10-07 ENCOUNTER — Ambulatory Visit: Admit: 2024-10-07 | Discharge: 2024-10-08 | Payer: MEDICARE | Primary: Family

## 2024-10-07 ENCOUNTER — Encounter: Admit: 2024-10-07 | Discharge: 2024-10-07 | Payer: MEDICARE | Primary: Family

## 2024-10-07 DIAGNOSIS — M810 Age-related osteoporosis without current pathological fracture: Principal | ICD-10-CM

## 2024-10-07 DIAGNOSIS — E559 Vitamin D deficiency, unspecified: Secondary | ICD-10-CM

## 2024-10-07 NOTE — Progress Notes [1]
 Chief Complaint   Patient presents with    Osteoporosis        Date of Service: 10/07/2024    Tyler Wolfe is a 69 y.o. male. DOB: Jul 01, 1956   MRN#: [id number]        HPI:  Osteoporosis  Dx: June 2022 when he had a bone density scan.  He has liver cirrhosis density scan was ordered by gastroenterologist.  He has never received any treatments in the past.  Lowest bone density at right femur.    Received Reclast  in April 2023, 12/2022, 12/2023.  Hx of fracture/fall: 22 years back  Hip/pelvic/thigh pain:none     Secondary contributors:  Kidney stones:none  Family hx of osteoporosis: Grandmother had hip fracture  Steroid use: None  He has history of alcohol abuse.  He stopped alcohol 6 years back.  He is a non-smoker.     Lifestyle intervention:  Exercise:   Not much  Dietary calcium  intake:  he has some cheese.   Calcium  supplement:  1 tablet Calcium  citrate: 300 mg daily.  He has vitamin D  deficiency on Vitamin D  supplement:  He takes vitamin D  2000 units daily.        Review of Systems   Constitutional:  Negative for fever.   Cardiovascular:  Negative for chest pain.       Past Medical History:    Hypertension     Surgical History:   Procedure Laterality Date    ESOPHAGOGASTRODUODENOSCOPY WITH BIOPSY - FLEXIBLE N/A 05/27/2019    Performed by Samule Delon SAILOR, MD at [facility] ENDO    ESOPHAGOGASTRODUODENOSCOPY WITH SPECIMEN COLLECTION BY BRUSHING/ WASHING N/A 01/20/2020    Performed by Samule Delon ORN, MD at [facility] ENDO    HAND TENDON SURGERY Left     HX KNEE SURGERY Right      Family History   Problem Relation Name Age of Onset    None Reported Mother      None Reported Father       Social History     Socioeconomic History    Marital status: Single   Tobacco Use    Smoking status: Former     Current packs/day: 0.00     Average packs/day: 0.3 packs/day     Types: Cigarettes     Quit date: 06/2021     Years since quitting: 3.3    Smokeless tobacco: Never   Vaping Use    Vaping status: Never Used   Substance and Sexual Activity    Alcohol use: Not Currently     Alcohol/week: 0.0 standard drinks of alcohol    Drug use: Not Currently    Sexual activity: Not Currently     Partners: Female     Birth control/protection: None       aMILoride   Calcium  Citrate-Vitamin D2 Tab  CHOLEcalciferoL  (vitamin D3)  EPINEPHrine  furosemide   RECLAST  IV      Objective:      aMILoride  (MIDAMOR ) 5 mg tablet TAKE 1 TABLET BY MOUTH EVERY DAY    Calcium  Citrate-Vitamin D2 315 mg-5 mcg (200 unit) tab Take 1 tablet by mouth twice daily.    CHOLEcalciferoL  (vitamin D3) (VITAMIN D3) 50 mcg (2,000 unit) tablet Take one tablet by mouth daily.    epinephrine(+) (EPIPEN JR) 0.15 mg/0.3 mL (1:2,000) syringe Inject 0.3 mL into the muscle as Needed.    furosemide  (LASIX ) 20 mg tablet Take one tablet by mouth every morning. Indications: accumulation of fluid caused by cirrhosis of the  liver    zoledronic  acid/mannitol -water  (RECLAST  IV) Administer  through vein. Once per year infusion @[facility]      There were no vitals filed for this visit.    There is no height or weight on file to calculate BMI.     Physical Exam  Vitals and nursing note reviewed.   Constitutional:       General: He is not in acute distress.     Appearance: Normal appearance. He is not ill-appearing, toxic-appearing or diaphoretic.   HENT:      Head: Normocephalic and atraumatic.      Nose: Nose normal.   Eyes:      Conjunctiva/sclera: Conjunctivae normal.   Neurological:      Mental Status: He is alert and oriented to person, place, and time.   Psychiatric:         Mood and Affect: Mood normal.               Comprehensive Metabolic Profile    Lab Results   Component Value Date/Time    NA 138 02/20/2024 11:02 AM    K 4.4 02/20/2024 11:02 AM    CL 100 02/20/2024 11:02 AM    CO2 29 02/20/2024 11:02 AM    GAP 9 02/20/2024 11:02 AM    BUN 12 02/20/2024 11:02 AM    CR 0.92 02/20/2024 11:02 AM    GLU 95 02/20/2024 11:02 AM    Lab Results   Component Value Date/Time    CA 9.7 02/20/2024 11:02 AM ALBUMIN 4.5 02/20/2024 11:02 AM    TOTPROT 7.7 02/20/2024 11:02 AM    ALKPHOS 47 02/20/2024 11:02 AM    AST 20 02/20/2024 11:02 AM    ALT 13 02/20/2024 11:02 AM    TOTBILI 0.9 02/20/2024 11:02 AM    GFR >60 02/20/2024 11:02 AM    GFRAA >60 07/20/2020 10:41 AM        09/2024 BMD      Assessment and Plan:    Tyler Wolfe was seen today for osteoporosis.    Diagnoses and all orders for this visit:    Age-related osteoporosis without current pathological fracture    Vitamin D  deficiency          Osteoporosis:  Contributing factors:  History of alcohol abuse  Liver cirrhosis due to hepatitis C.  Received Reclast  x 3    Bone density is lower at vertebrae.  Bone density is stable at femur.  Bone density numbers are not very low, in osteopenia range.  He has received 3 infusions of Reclast .  Will monitor without any more Reclast  infusions.  Repeat bone density next year.    Good calcium  and vitamin D  replacement discussed.  Continue 1 serving of dairy daily.  Continue calcium  citrate 1 tablets daily.  It gives him 300 mg.  Diet discussed, protein intake.    Continue vitamin D  2000 units daily.  He will send me the vitamin D  results.  He did labs yesterday.        Electronically signed by Durward Dad, MD 10/07/2024

## 2024-10-08 ENCOUNTER — Encounter: Admit: 2024-10-08 | Discharge: 2024-10-08 | Payer: MEDICARE | Primary: Family

## 2024-10-13 ENCOUNTER — Encounter: Admit: 2024-10-13 | Discharge: 2024-10-13 | Payer: MEDICARE | Primary: Family
